# Patient Record
Sex: Female | Born: 1945 | Race: White | Hispanic: No | Marital: Married | Smoking: Never smoker
Health system: Southern US, Community
[De-identification: ages and names within clinical notes are randomized; demographics above are authoritative.]

## PROBLEM LIST (undated history)

## (undated) DIAGNOSIS — Z9889 Other specified postprocedural states: Secondary | ICD-10-CM

## (undated) DIAGNOSIS — M5030 Other cervical disc degeneration, unspecified cervical region: Secondary | ICD-10-CM

## (undated) DIAGNOSIS — I319 Disease of pericardium, unspecified: Secondary | ICD-10-CM

## (undated) DIAGNOSIS — G629 Polyneuropathy, unspecified: Secondary | ICD-10-CM

## (undated) DIAGNOSIS — C539 Malignant neoplasm of cervix uteri, unspecified: Secondary | ICD-10-CM

## (undated) DIAGNOSIS — C801 Malignant (primary) neoplasm, unspecified: Secondary | ICD-10-CM

## (undated) DIAGNOSIS — K222 Esophageal obstruction: Secondary | ICD-10-CM

## (undated) DIAGNOSIS — I10 Essential (primary) hypertension: Secondary | ICD-10-CM

## (undated) DIAGNOSIS — I639 Cerebral infarction, unspecified: Secondary | ICD-10-CM

## (undated) DIAGNOSIS — I340 Nonrheumatic mitral (valve) insufficiency: Secondary | ICD-10-CM

## (undated) DIAGNOSIS — I509 Heart failure, unspecified: Secondary | ICD-10-CM

## (undated) DIAGNOSIS — R112 Nausea with vomiting, unspecified: Secondary | ICD-10-CM

## (undated) DIAGNOSIS — I2510 Atherosclerotic heart disease of native coronary artery without angina pectoris: Secondary | ICD-10-CM

## (undated) DIAGNOSIS — E785 Hyperlipidemia, unspecified: Secondary | ICD-10-CM

## (undated) DIAGNOSIS — T8859XA Other complications of anesthesia, initial encounter: Secondary | ICD-10-CM

## (undated) DIAGNOSIS — T4145XA Adverse effect of unspecified anesthetic, initial encounter: Secondary | ICD-10-CM

## (undated) HISTORY — DX: Polyneuropathy, unspecified: G62.9

## (undated) HISTORY — DX: Esophageal obstruction: K22.2

## (undated) HISTORY — PX: CHOLECYSTECTOMY: SHX55

## (undated) HISTORY — PX: CORONARY ARTERY BYPASS GRAFT: SHX141

## (undated) HISTORY — DX: Other cervical disc degeneration, unspecified cervical region: M50.30

## (undated) HISTORY — DX: Nonrheumatic mitral (valve) insufficiency: I34.0

## (undated) HISTORY — PX: OTHER SURGICAL HISTORY: SHX169

## (undated) HISTORY — DX: Disease of pericardium, unspecified: I31.9

## (undated) HISTORY — DX: Hyperlipidemia, unspecified: E78.5

## (undated) HISTORY — DX: Malignant (primary) neoplasm, unspecified: C80.1

## (undated) HISTORY — DX: Malignant neoplasm of cervix uteri, unspecified: C53.9

## (undated) HISTORY — PX: ABDOMINAL HYSTERECTOMY: SHX81

## (undated) HISTORY — PX: APPENDECTOMY: SHX54

## (undated) HISTORY — DX: Atherosclerotic heart disease of native coronary artery without angina pectoris: I25.10

---

## 1978-10-05 HISTORY — PX: BREAST SURGERY: SHX581

## 1980-10-05 DIAGNOSIS — C801 Malignant (primary) neoplasm, unspecified: Secondary | ICD-10-CM

## 1980-10-05 HISTORY — DX: Malignant (primary) neoplasm, unspecified: C80.1

## 2000-09-22 ENCOUNTER — Encounter: Payer: Self-pay | Admitting: Internal Medicine

## 2000-09-22 ENCOUNTER — Ambulatory Visit (HOSPITAL_COMMUNITY): Admission: RE | Admit: 2000-09-22 | Discharge: 2000-09-22 | Payer: Self-pay | Admitting: Internal Medicine

## 2000-09-22 HISTORY — PX: ESOPHAGOGASTRODUODENOSCOPY: SHX1529

## 2001-10-05 DIAGNOSIS — I2510 Atherosclerotic heart disease of native coronary artery without angina pectoris: Secondary | ICD-10-CM

## 2001-10-05 DIAGNOSIS — I340 Nonrheumatic mitral (valve) insufficiency: Secondary | ICD-10-CM

## 2001-10-05 HISTORY — DX: Nonrheumatic mitral (valve) insufficiency: I34.0

## 2001-10-05 HISTORY — DX: Atherosclerotic heart disease of native coronary artery without angina pectoris: I25.10

## 2002-03-27 ENCOUNTER — Inpatient Hospital Stay (HOSPITAL_COMMUNITY): Admission: EM | Admit: 2002-03-27 | Discharge: 2002-04-14 | Payer: Self-pay | Admitting: *Deleted

## 2002-03-27 ENCOUNTER — Encounter: Payer: Self-pay | Admitting: Internal Medicine

## 2002-03-30 ENCOUNTER — Encounter (HOSPITAL_COMMUNITY): Payer: Self-pay | Admitting: Dentistry

## 2002-03-30 ENCOUNTER — Encounter: Payer: Self-pay | Admitting: Internal Medicine

## 2002-04-01 ENCOUNTER — Encounter: Payer: Self-pay | Admitting: Thoracic Surgery (Cardiothoracic Vascular Surgery)

## 2002-04-03 ENCOUNTER — Encounter: Payer: Self-pay | Admitting: Thoracic Surgery (Cardiothoracic Vascular Surgery)

## 2002-04-04 ENCOUNTER — Encounter: Payer: Self-pay | Admitting: Thoracic Surgery (Cardiothoracic Vascular Surgery)

## 2002-04-05 ENCOUNTER — Encounter: Payer: Self-pay | Admitting: Thoracic Surgery (Cardiothoracic Vascular Surgery)

## 2002-04-07 ENCOUNTER — Encounter: Payer: Self-pay | Admitting: Thoracic Surgery (Cardiothoracic Vascular Surgery)

## 2002-04-08 ENCOUNTER — Encounter: Payer: Self-pay | Admitting: Surgery

## 2002-04-09 ENCOUNTER — Encounter: Payer: Self-pay | Admitting: Gastroenterology

## 2002-05-17 ENCOUNTER — Encounter
Admission: RE | Admit: 2002-05-17 | Discharge: 2002-05-17 | Payer: Self-pay | Admitting: Thoracic Surgery (Cardiothoracic Vascular Surgery)

## 2002-05-17 ENCOUNTER — Encounter: Payer: Self-pay | Admitting: Thoracic Surgery (Cardiothoracic Vascular Surgery)

## 2004-08-14 ENCOUNTER — Ambulatory Visit: Payer: Self-pay | Admitting: Internal Medicine

## 2004-08-14 ENCOUNTER — Ambulatory Visit (HOSPITAL_COMMUNITY): Admission: RE | Admit: 2004-08-14 | Discharge: 2004-08-14 | Payer: Self-pay | Admitting: Internal Medicine

## 2004-09-04 ENCOUNTER — Ambulatory Visit (HOSPITAL_COMMUNITY): Admission: RE | Admit: 2004-09-04 | Discharge: 2004-09-04 | Payer: Self-pay | Admitting: Internal Medicine

## 2005-02-25 ENCOUNTER — Ambulatory Visit: Payer: Self-pay | Admitting: Internal Medicine

## 2005-03-04 ENCOUNTER — Ambulatory Visit: Payer: Self-pay | Admitting: Internal Medicine

## 2005-03-11 ENCOUNTER — Ambulatory Visit: Payer: Self-pay | Admitting: Internal Medicine

## 2005-04-29 ENCOUNTER — Ambulatory Visit: Payer: Self-pay | Admitting: Internal Medicine

## 2005-05-21 ENCOUNTER — Ambulatory Visit: Payer: Self-pay | Admitting: Internal Medicine

## 2006-03-02 ENCOUNTER — Ambulatory Visit: Payer: Self-pay | Admitting: Internal Medicine

## 2006-03-09 ENCOUNTER — Ambulatory Visit: Payer: Self-pay | Admitting: Internal Medicine

## 2006-07-15 ENCOUNTER — Ambulatory Visit: Payer: Self-pay | Admitting: Internal Medicine

## 2006-07-15 LAB — CONVERTED CEMR LAB
Crystals: NEGATIVE
Mucus, UA: NEGATIVE

## 2006-08-10 ENCOUNTER — Ambulatory Visit: Payer: Self-pay | Admitting: Internal Medicine

## 2006-09-06 ENCOUNTER — Ambulatory Visit: Payer: Self-pay | Admitting: Internal Medicine

## 2006-10-05 HISTORY — PX: SPINE SURGERY: SHX786

## 2007-01-04 ENCOUNTER — Ambulatory Visit: Payer: Self-pay | Admitting: Internal Medicine

## 2007-03-16 ENCOUNTER — Ambulatory Visit: Payer: Self-pay | Admitting: Internal Medicine

## 2007-03-16 LAB — CONVERTED CEMR LAB
AST: 19 units/L (ref 0–37)
Albumin: 3.8 g/dL (ref 3.5–5.2)
Basophils Absolute: 0.1 10*3/uL (ref 0.0–0.1)
Bilirubin, Direct: 0.1 mg/dL (ref 0.0–0.3)
Chloride: 102 meq/L (ref 96–112)
Cholesterol: 135 mg/dL (ref 0–200)
Eosinophils Absolute: 0.2 10*3/uL (ref 0.0–0.6)
GFR calc Af Amer: 110 mL/min
GFR calc non Af Amer: 91 mL/min
Glucose, Bld: 151 mg/dL — ABNORMAL HIGH (ref 70–99)
HCT: 32.7 % — ABNORMAL LOW (ref 36.0–46.0)
HDL: 41.4 mg/dL (ref >39.0)
Hemoglobin, Urine: NEGATIVE
Hgb A1c MFr Bld: 7.6 % — ABNORMAL HIGH (ref 4.6–6.0)
Ketones, ur: NEGATIVE mg/dL
LDL Cholesterol: 78 mg/dL (ref 0–99)
Lymphocytes Relative: 26.9 % (ref 12.0–46.0)
MCHC: 35.1 g/dL (ref 30.0–36.0)
MCV: 92.8 fL (ref 78.0–100.0)
Neutro Abs: 4.9 10*3/uL (ref 1.4–7.7)
Neutrophils Relative %: 62 % (ref 43.0–77.0)
Potassium: 4.6 meq/L (ref 3.5–5.1)
RBC: 3.52 M/uL — ABNORMAL LOW (ref 3.87–5.11)
Sodium: 138 meq/L (ref 135–145)
TSH: 0.82 microintl units/mL (ref 0.35–5.50)
Total CHOL/HDL Ratio: 3.3
Urine Glucose: NEGATIVE mg/dL

## 2007-03-22 ENCOUNTER — Ambulatory Visit: Payer: Self-pay | Admitting: Internal Medicine

## 2007-03-28 ENCOUNTER — Ambulatory Visit: Payer: Self-pay | Admitting: Internal Medicine

## 2007-05-25 ENCOUNTER — Ambulatory Visit: Payer: Self-pay | Admitting: Endocrinology

## 2007-05-25 LAB — CONVERTED CEMR LAB
BUN: 19 mg/dL (ref 6–23)
Creatinine, Ser: 0.9 mg/dL (ref 0.4–1.2)

## 2007-05-26 ENCOUNTER — Encounter: Admission: RE | Admit: 2007-05-26 | Discharge: 2007-05-26 | Payer: Self-pay | Admitting: Endocrinology

## 2007-06-07 ENCOUNTER — Observation Stay (HOSPITAL_COMMUNITY): Admission: RE | Admit: 2007-06-07 | Discharge: 2007-06-08 | Payer: Self-pay | Admitting: Neurological Surgery

## 2007-07-05 ENCOUNTER — Ambulatory Visit: Payer: Self-pay | Admitting: Internal Medicine

## 2007-07-05 LAB — CONVERTED CEMR LAB: Hgb A1c MFr Bld: 7.1 % — ABNORMAL HIGH (ref 4.6–6.0)

## 2007-08-19 ENCOUNTER — Encounter: Payer: Self-pay | Admitting: Internal Medicine

## 2007-09-12 ENCOUNTER — Ambulatory Visit: Payer: Self-pay

## 2007-10-04 ENCOUNTER — Ambulatory Visit: Payer: Self-pay | Admitting: Internal Medicine

## 2007-10-04 ENCOUNTER — Ambulatory Visit: Payer: Self-pay

## 2007-10-04 ENCOUNTER — Ambulatory Visit (HOSPITAL_COMMUNITY): Admission: RE | Admit: 2007-10-04 | Discharge: 2007-10-04 | Payer: Self-pay | Admitting: Internal Medicine

## 2007-10-14 ENCOUNTER — Encounter: Payer: Self-pay | Admitting: Internal Medicine

## 2007-10-14 DIAGNOSIS — I2510 Atherosclerotic heart disease of native coronary artery without angina pectoris: Secondary | ICD-10-CM | POA: Insufficient documentation

## 2007-10-14 DIAGNOSIS — Z853 Personal history of malignant neoplasm of breast: Secondary | ICD-10-CM

## 2007-10-14 DIAGNOSIS — E119 Type 2 diabetes mellitus without complications: Secondary | ICD-10-CM

## 2007-10-14 DIAGNOSIS — M5030 Other cervical disc degeneration, unspecified cervical region: Secondary | ICD-10-CM

## 2007-10-14 DIAGNOSIS — E785 Hyperlipidemia, unspecified: Secondary | ICD-10-CM | POA: Insufficient documentation

## 2007-10-14 DIAGNOSIS — I319 Disease of pericardium, unspecified: Secondary | ICD-10-CM | POA: Insufficient documentation

## 2007-10-14 DIAGNOSIS — G5790 Unspecified mononeuropathy of unspecified lower limb: Secondary | ICD-10-CM | POA: Insufficient documentation

## 2007-11-09 ENCOUNTER — Ambulatory Visit: Payer: Self-pay | Admitting: Internal Medicine

## 2007-11-09 LAB — CONVERTED CEMR LAB
Basophils Absolute: 0 10*3/uL (ref 0.0–0.1)
Calcium: 9.2 mg/dL (ref 8.4–10.5)
Chloride: 100 meq/L (ref 96–112)
Eosinophils Absolute: 0.2 10*3/uL (ref 0.0–0.6)
Eosinophils Relative: 2.2 % (ref 0.0–5.0)
GFR calc Af Amer: 82 mL/min
GFR calc non Af Amer: 68 mL/min
Glucose, Bld: 266 mg/dL — ABNORMAL HIGH (ref 70–99)
Lymphocytes Relative: 18 % (ref 12.0–46.0)
MCHC: 33.6 g/dL (ref 30.0–36.0)
MCV: 90.5 fL (ref 78.0–100.0)
Monocytes Relative: 6.3 % (ref 3.0–11.0)
Neutro Abs: 7.3 10*3/uL (ref 1.4–7.7)
Platelets: 287 10*3/uL (ref 150–400)
RBC: 4.03 M/uL (ref 3.87–5.11)
aPTT: 26.8 s (ref 21.7–29.8)

## 2007-11-15 ENCOUNTER — Inpatient Hospital Stay (HOSPITAL_BASED_OUTPATIENT_CLINIC_OR_DEPARTMENT_OTHER): Admission: RE | Admit: 2007-11-15 | Discharge: 2007-11-15 | Payer: Self-pay | Admitting: Internal Medicine

## 2007-11-15 ENCOUNTER — Ambulatory Visit: Payer: Self-pay | Admitting: Cardiology

## 2007-11-16 ENCOUNTER — Inpatient Hospital Stay (HOSPITAL_COMMUNITY): Admission: AD | Admit: 2007-11-16 | Discharge: 2007-11-17 | Payer: Self-pay | Admitting: Cardiology

## 2007-11-16 ENCOUNTER — Ambulatory Visit: Payer: Self-pay | Admitting: Cardiovascular Disease

## 2007-11-17 ENCOUNTER — Encounter: Payer: Self-pay | Admitting: Internal Medicine

## 2007-12-02 ENCOUNTER — Ambulatory Visit: Payer: Self-pay | Admitting: Internal Medicine

## 2007-12-06 ENCOUNTER — Ambulatory Visit: Payer: Self-pay | Admitting: Internal Medicine

## 2007-12-06 LAB — CONVERTED CEMR LAB
ALT: 13 units/L (ref 0–35)
HDL: 43.2 mg/dL (ref >39.0)
Triglycerides: 116 mg/dL (ref 0–149)
VLDL: 23 mg/dL (ref 0–40)

## 2007-12-28 ENCOUNTER — Encounter: Payer: Self-pay | Admitting: Internal Medicine

## 2008-01-02 ENCOUNTER — Ambulatory Visit: Payer: Self-pay | Admitting: Internal Medicine

## 2008-01-04 ENCOUNTER — Encounter: Payer: Self-pay | Admitting: Internal Medicine

## 2008-01-04 ENCOUNTER — Ambulatory Visit: Payer: Self-pay | Admitting: Internal Medicine

## 2008-01-26 ENCOUNTER — Encounter: Payer: Self-pay | Admitting: Internal Medicine

## 2008-02-03 ENCOUNTER — Encounter: Payer: Self-pay | Admitting: Internal Medicine

## 2008-02-24 ENCOUNTER — Ambulatory Visit: Payer: Self-pay | Admitting: Internal Medicine

## 2008-02-24 ENCOUNTER — Encounter (HOSPITAL_COMMUNITY): Admission: RE | Admit: 2008-02-24 | Discharge: 2008-04-04 | Payer: Self-pay | Admitting: Internal Medicine

## 2008-02-29 ENCOUNTER — Ambulatory Visit: Payer: Self-pay | Admitting: Internal Medicine

## 2008-02-29 LAB — CONVERTED CEMR LAB
BUN: 22 mg/dL (ref 6–23)
Chloride: 103 meq/L (ref 96–112)
GFR calc Af Amer: 73 mL/min
GFR calc non Af Amer: 60 mL/min
Glucose, Bld: 225 mg/dL — ABNORMAL HIGH (ref 70–99)
Potassium: 4.7 meq/L (ref 3.5–5.1)
Sodium: 136 meq/L (ref 135–145)

## 2008-03-09 ENCOUNTER — Telehealth: Payer: Self-pay | Admitting: Internal Medicine

## 2008-03-22 ENCOUNTER — Telehealth: Payer: Self-pay | Admitting: Internal Medicine

## 2008-04-11 ENCOUNTER — Ambulatory Visit: Payer: Self-pay | Admitting: Internal Medicine

## 2008-04-11 LAB — CONVERTED CEMR LAB
BUN: 26 mg/dL — ABNORMAL HIGH (ref 6–23)
CO2: 28 meq/L (ref 19–32)
Calcium: 9.2 mg/dL (ref 8.4–10.5)
Chloride: 99 meq/L (ref 96–112)
Creatinine, Ser: 1.1 mg/dL (ref 0.4–1.2)
GFR calc Af Amer: 65 mL/min
GFR calc non Af Amer: 54 mL/min
Glucose, Bld: 243 mg/dL — ABNORMAL HIGH (ref 70–99)
Potassium: 4.4 meq/L (ref 3.5–5.1)
Sodium: 136 meq/L (ref 135–145)

## 2008-04-12 ENCOUNTER — Telehealth: Payer: Self-pay | Admitting: Internal Medicine

## 2008-04-18 ENCOUNTER — Encounter: Payer: Self-pay | Admitting: Internal Medicine

## 2008-05-28 ENCOUNTER — Ambulatory Visit: Payer: Self-pay | Admitting: Internal Medicine

## 2008-06-19 ENCOUNTER — Telehealth: Payer: Self-pay | Admitting: Internal Medicine

## 2008-06-20 ENCOUNTER — Ambulatory Visit: Payer: Self-pay | Admitting: Internal Medicine

## 2008-06-20 ENCOUNTER — Telehealth: Payer: Self-pay | Admitting: Internal Medicine

## 2008-06-20 DIAGNOSIS — K3189 Other diseases of stomach and duodenum: Secondary | ICD-10-CM | POA: Insufficient documentation

## 2008-06-20 DIAGNOSIS — R1013 Epigastric pain: Secondary | ICD-10-CM

## 2008-06-21 ENCOUNTER — Telehealth: Payer: Self-pay | Admitting: Internal Medicine

## 2008-09-13 ENCOUNTER — Telehealth: Payer: Self-pay | Admitting: Internal Medicine

## 2008-10-03 DIAGNOSIS — R0602 Shortness of breath: Secondary | ICD-10-CM

## 2008-12-07 ENCOUNTER — Telehealth: Payer: Self-pay | Admitting: Internal Medicine

## 2009-01-09 ENCOUNTER — Ambulatory Visit: Payer: Self-pay | Admitting: Internal Medicine

## 2009-01-09 LAB — CONVERTED CEMR LAB
BUN: 32 mg/dL — ABNORMAL HIGH (ref 6–23)
Basophils Absolute: 0.1 10*3/uL (ref 0.0–0.1)
Calcium: 10.1 mg/dL (ref 8.4–10.5)
Chloride: 104 meq/L (ref 96–112)
Cholesterol: 128 mg/dL (ref 0–200)
Creatinine, Ser: 1.3 mg/dL — ABNORMAL HIGH (ref 0.4–1.2)
Eosinophils Absolute: 0.2 10*3/uL (ref 0.0–0.7)
HCT: 28.6 % — ABNORMAL LOW (ref 36.0–46.0)
HDL: 38.2 mg/dL — ABNORMAL LOW (ref >39.00)
Hemoglobin: 9.7 g/dL — ABNORMAL LOW (ref 12.0–15.0)
Lymphs Abs: 1.8 10*3/uL (ref 0.7–4.0)
MCHC: 33.9 g/dL (ref 30.0–36.0)
MCV: 88.6 fL (ref 78.0–100.0)
Monocytes Absolute: 0.6 10*3/uL (ref 0.1–1.0)
Monocytes Relative: 9 % (ref 3.0–12.0)
Neutro Abs: 4.5 10*3/uL (ref 1.4–7.7)
Platelets: 266 10*3/uL (ref 150.0–400.0)
RDW: 13.7 % (ref 11.5–14.6)
Triglycerides: 121 mg/dL (ref 0.0–149.0)
VLDL: 24.2 mg/dL (ref 0.0–40.0)
Vitamin B-12: 463 pg/mL (ref 211–911)

## 2009-01-15 ENCOUNTER — Ambulatory Visit: Payer: Self-pay | Admitting: Internal Medicine

## 2009-01-15 DIAGNOSIS — D649 Anemia, unspecified: Secondary | ICD-10-CM | POA: Insufficient documentation

## 2009-01-15 LAB — CONVERTED CEMR LAB: Hemoglobin: 10.1 g/dL — ABNORMAL LOW (ref 12.0–15.0)

## 2009-01-31 ENCOUNTER — Telehealth: Payer: Self-pay | Admitting: Internal Medicine

## 2009-03-28 ENCOUNTER — Telehealth: Payer: Self-pay | Admitting: Internal Medicine

## 2009-05-06 ENCOUNTER — Telehealth: Payer: Self-pay | Admitting: Internal Medicine

## 2009-05-24 ENCOUNTER — Telehealth (INDEPENDENT_AMBULATORY_CARE_PROVIDER_SITE_OTHER): Payer: Self-pay | Admitting: *Deleted

## 2009-08-19 ENCOUNTER — Telehealth: Payer: Self-pay | Admitting: Internal Medicine

## 2009-09-06 ENCOUNTER — Telehealth: Payer: Self-pay | Admitting: Internal Medicine

## 2009-10-08 ENCOUNTER — Telehealth: Payer: Self-pay | Admitting: Internal Medicine

## 2009-11-13 ENCOUNTER — Ambulatory Visit: Payer: Self-pay | Admitting: Internal Medicine

## 2009-11-13 LAB — CONVERTED CEMR LAB
Alkaline Phosphatase: 59 units/L (ref 39–117)
Basophils Absolute: 0.1 10*3/uL (ref 0.0–0.1)
Bilirubin, Direct: 0.1 mg/dL (ref 0.0–0.3)
CO2: 28 meq/L (ref 19–32)
Calcium: 8.9 mg/dL (ref 8.4–10.5)
Creatinine, Ser: 1.3 mg/dL — ABNORMAL HIGH (ref 0.4–1.2)
Eosinophils Absolute: 0.2 10*3/uL (ref 0.0–0.7)
Glucose, Bld: 112 mg/dL — ABNORMAL HIGH (ref 70–99)
HDL: 43.8 mg/dL (ref >39.00)
Hgb A1c MFr Bld: 7.3 % — ABNORMAL HIGH (ref 4.6–6.5)
Lymphocytes Relative: 25.7 % (ref 12.0–46.0)
MCHC: 32.4 g/dL (ref 30.0–36.0)
Neutrophils Relative %: 62.2 % (ref 43.0–77.0)
RBC: 3.44 M/uL — ABNORMAL LOW (ref 3.87–5.11)
RDW: 15.1 % — ABNORMAL HIGH (ref 11.5–14.6)
Specific Gravity, Urine: 1.01 (ref 1.000–1.030)
Total Bilirubin: 0.5 mg/dL (ref 0.3–1.2)
Total CHOL/HDL Ratio: 3
Total Protein, Urine: NEGATIVE mg/dL
Triglycerides: 161 mg/dL — ABNORMAL HIGH (ref 0.0–149.0)
Urine Glucose: NEGATIVE mg/dL
Urobilinogen, UA: 0.2 (ref 0.0–1.0)
VLDL: 32.2 mg/dL (ref 0.0–40.0)

## 2009-11-19 ENCOUNTER — Ambulatory Visit: Payer: Self-pay | Admitting: Internal Medicine

## 2009-12-03 ENCOUNTER — Encounter (INDEPENDENT_AMBULATORY_CARE_PROVIDER_SITE_OTHER): Payer: Self-pay | Admitting: *Deleted

## 2009-12-03 ENCOUNTER — Telehealth: Payer: Self-pay | Admitting: Internal Medicine

## 2010-01-17 ENCOUNTER — Telehealth: Payer: Self-pay | Admitting: Internal Medicine

## 2010-02-03 ENCOUNTER — Encounter: Payer: Self-pay | Admitting: Internal Medicine

## 2010-02-03 LAB — HM MAMMOGRAPHY: HM Mammogram: NORMAL

## 2010-02-18 ENCOUNTER — Ambulatory Visit: Payer: Self-pay | Admitting: Internal Medicine

## 2010-02-18 LAB — CONVERTED CEMR LAB: Hemoglobin: 12 g/dL (ref 12.0–15.0)

## 2010-02-21 ENCOUNTER — Telehealth: Payer: Self-pay | Admitting: Internal Medicine

## 2010-03-12 ENCOUNTER — Telehealth (INDEPENDENT_AMBULATORY_CARE_PROVIDER_SITE_OTHER): Payer: Self-pay | Admitting: *Deleted

## 2010-05-22 ENCOUNTER — Telehealth: Payer: Self-pay | Admitting: Internal Medicine

## 2010-07-17 ENCOUNTER — Ambulatory Visit: Payer: Self-pay | Admitting: Internal Medicine

## 2010-07-17 DIAGNOSIS — M79609 Pain in unspecified limb: Secondary | ICD-10-CM

## 2010-09-04 ENCOUNTER — Telehealth: Payer: Self-pay | Admitting: Internal Medicine

## 2010-11-04 NOTE — Progress Notes (Signed)
  Phone Note Refill Request Message from:  Fax from Pharmacy on October 08, 2009 3:02 PM  Refills Requested: Medication #1:  ZOLPIDEM TARTRATE 10 MG  TABS take 1 by mouth qhs can pt have refill? Please Advise  Initial call taken by: Ami Bullins CMA,  October 08, 2009 3:02 PM  Follow-up for Phone Call        OK for refill as needed  Follow-up by: Jacques Navy MD,  October 08, 2009 6:23 PM    Prescriptions: ZOLPIDEM TARTRATE 10 MG  TABS (ZOLPIDEM TARTRATE) take 1 by mouth qhs  #30 x 1   Entered by:   Ami Bullins CMA   Authorized by:   Jacques Navy MD   Signed by:   Bill Salinas CMA on 10/09/2009   Method used:   Telephoned to ...       [facility]       [address]       [place], [place]  [address]       Ph: [phone]       Fax: [phone]   RxID:   [id number]

## 2010-11-04 NOTE — Assessment & Plan Note (Signed)
Summary: STOMACH PROBLEMS/NWS   Vital Signs:  Patient profile:   65 year old female Height:      65 inches Weight:      190 pounds BMI:     31.73 O2 Sat:      98 % on Room air Temp:     97.9 degrees F oral Pulse rate:   82 / minute BP sitting:   114 / 60  (right arm) Cuff size:   large  Vitals Entered By: Bill Salinas CMA (Feb 18, 2010 1:53 PM)  O2 Flow:  Room air CC: pt here with c/o decreased appetite with decreased energy/ ab   Primary Care Provider:  Jacques Navy MD  CC:  pt here with c/o decreased appetite with decreased energy/ ab.  History of Present Illness: Patient reports a five month history of decreased appetite and stomach pain.  In February she was seen in the office and started on Nexium.  Her symptoms improved dramatically on Nexium, but returned when she switched to Ranitidine.  After a trial of ranitidine with return of symptoms she called the office and was counseled to try Prilosec.  She is currently taking Prilosec 20mg  once daily without complete resolution of symptoms.    She currently describes her discomfort as back pain- a dull ache across the mid thoracic region.  She has decreased appetite and has to force herself to eat food.  She had one episode of nausea and reports no vomiting.  She denies heartburn/reflux symptoms.  She says that the symptoms are worst after eating breakfast and taking her morning meds.  She does try to eat foods that she thinks improves symptoms (dairy, cottage cheese) and avoid foods that seem to worsen symptoms (fatty foods).  She does have intermittent diarrhea which she has associated with fat intake after her gallbladder removal.  She has seen no blood in her stools, although they have turned dark since she began taking ferrous sulfate in February.    She was found to be anemic at her visit in February.  She reports continued lack of energy, which her husband says is significantly worse.    Current Medications  (verified): 1)  Metformin Hcl 1000 Mg  Tabs (Metformin Hcl) .... Take 1 By Mouth Two Times A Day Qd 2)  Glipizide Xl 10 Mg  Tb24 (Glipizide) .... Take 1 By Mouth Qd 3)  Lisinopril 20 Mg  Tabs (Lisinopril) .... Take 1 By Mouth Qd 4)  Simvastatin 40 Mg  Tabs (Simvastatin) .... Take 1 By Mouth Qd 5)  Zolpidem Tartrate 10 Mg  Tabs (Zolpidem Tartrate) .... Take 1 By Mouth Qhs 6)  Lasix 40 Mg Tabs (Furosemide) .... Take 1 Tablet By Mouth Once A Day and  1 1/2 Tab As Needed 7)  Plavix 75 Mg Tabs (Clopidogrel Bisulfate) .Marland Kitchen.. 1 By Mouth Once Daily 8)  Metoprolol Tartrate 25 Mg Tabs (Metoprolol Tartrate) .... 1/2 Tab Two Times A Day 9)  Ranitidine Hcl 150 Mg Caps (Ranitidine Hcl) .Marland Kitchen.. 1 Two Times A Day 10)  Oxybutynin Chloride 5 Mg Tabs (Oxybutynin Chloride) .Marland Kitchen.. 1 By Mouth Qpm 11)  Aspirin 81 Mg  Tabs (Aspirin) .... Take 1 Tablet By Mouth Once A Day 12)  Endocet 5-325 Mg Tabs (Oxycodone-Acetaminophen) .Marland Kitchen.. 1 By Mouth Q6 As Needed Neck Pain 13)  Ferrous Sulfate 325 (65 Fe) Mg Tabs (Ferrous Sulfate) .Marland Kitchen.. 1 By Mouth Two Times A Day 14)  Prilosec Otc 20 Mg Tbec (Omeprazole Magnesium) .Marland Kitchen.. 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  The patient denies fever, weight gain, chest pain, syncope, dyspnea on exertion, prolonged cough, headaches, hemoptysis, hematochezia, and severe indigestion/heartburn.    Physical Exam  General:  Alert well developed woman who appears tired and somewhat pale. Head:  normocephalic and atraumatic.   Lungs:  normal respiratory effort, no accessory muscle use, normal breath sounds, no crackles, and no wheezes.   Heart:  normal rate, regular rhythm, no murmur, no gallop, and no rub.   Abdomen:  soft, non-tender, normal bowel sounds, no distention, no masses, no guarding, no rigidity, no rebound tenderness, no hepatomegaly, and no splenomegaly.   Neurologic:  alert & oriented X3.     Impression & Recommendations:  Problem # 1:  DYSPEPSIA (ICD-536.8) She has continued to have abdominal and stomach issues since her visit in February.  She did have a positive response to PPIs in February.  It is possible that she is not currently on an adequate dose of prilosec to control her symptoms.    Plan- Double dose of Prilosec to 40mg  once daily          If symptoms are not improved with increased dose will have to consider further testing, such as EGD.  Problem # 2:  ANEMIA, NORMOCYTIC (ICD-285.9) Patient reports continued decrease in energy.  She has been taking the ferrous sulfate daily.  It is possible that this anemia is related to a GI bleed given her recurrent stomach problems and dark stools.  An FOBT in February was negative.    Plan- Hemoglobin check today          If worsening anemia will consider upper and lower endoscopy.  Her updated medication list for this problem includes:    Ferrous Sulfate 325 (65 Fe) Mg Tabs (Ferrous sulfate) .Marland Kitchen... 1 by mouth two times a  day  Orders: TLB-Hemoglobin (Hgb) (85018-HGB)  Problem # 3:  CORONARY ARTERY DISEASE (ICD-414.00) Patient has not been taking aspirin recently due to GI symptoms.  She is currently on Plavix secondary to her bare metal stent placed several years ago.    Plan- Continue Plavix until it is clear if patient has a GI bleed or not.  If GI bleed is ruled out will discontinue plavix and restart low dose aspirin.   Her updated medication list for this problem includes:    Lisinopril 20 Mg Tabs (Lisinopril) .Marland Kitchen... Take 1 by mouth qd    Lasix 40 Mg Tabs (Furosemide) .Marland Kitchen... Take 1 tablet by mouth once a day and  1 1/2 tab as needed    Plavix 75 Mg Tabs (Clopidogrel bisulfate) .[facility] 1  by mouth once daily    Metoprolol Tartrate 25 Mg Tabs (Metoprolol tartrate) .Marland Kitchen... 1/2 tab two times a day    Aspirin 81 Mg Tabs (Aspirin) .Marland Kitchen... Take 1 tablet by mouth once a day  Complete Medication List: 1)  Metformin Hcl 1000 Mg Tabs (Metformin hcl) .... Take 1 by mouth two times a day qd 2)  Glipizide Xl 10 Mg Tb24 (Glipizide) .... Take 1 by mouth qd 3)  Lisinopril 20 Mg Tabs (Lisinopril) .... Take 1 by mouth qd 4)  Simvastatin 40 Mg Tabs (Simvastatin) .... Take 1 by mouth qd 5)  Zolpidem Tartrate 10 Mg Tabs (Zolpidem tartrate) .... Take 1 by mouth qhs 6)  Lasix 40 Mg Tabs (Furosemide) .... Take 1 tablet by mouth once a day and  1 1/2 tab as needed 7)  Plavix 75 Mg Tabs (Clopidogrel bisulfate) .Marland Kitchen.. 1 by mouth once daily 8)  Metoprolol Tartrate 25 Mg Tabs (Metoprolol tartrate) .... 1/2 tab two times a day 9)  Ranitidine Hcl 150 Mg Caps (Ranitidine hcl) .Marland Kitchen.. 1 two times a day 10)  Oxybutynin Chloride 5 Mg Tabs (Oxybutynin chloride) .Marland Kitchen.. 1 by mouth qpm 11)  Aspirin 81 Mg Tabs (Aspirin) .... Take 1 tablet by mouth once a day 12)  Endocet 5-325 Mg Tabs (Oxycodone-acetaminophen) .Marland Kitchen.. 1 by mouth q6 as needed neck pain 13)  Ferrous Sulfate 325 (65 Fe) Mg Tabs (Ferrous sulfate) .Marland Kitchen.. 1 by mouth two times a day 14)  Prilosec  Otc 20 Mg Tbec (Omeprazole magnesium) .Marland Kitchen.. 1 once daily

## 2010-11-04 NOTE — Progress Notes (Signed)
  Phone Note Other Incoming   Request: Send information Summary of Call: Request for records received from [facility]. Request forwarded to [employer].     

## 2010-11-04 NOTE — Letter (Signed)
Summary: [facility] Referral (unable to schedule) Notification  [facility]  [address] [place], [place] [address]   Phone: [phone]  Fax: [phone]      December 03, 2009 Jacqueline Lamb 04-Dec-1945 MRN: [id number]   [facility] [address] [place], [place]  [address]   Dear Dr. Debby Bud:   Thank you for your kind referral of the above patient. We have attempted to schedule the recommended Colonoscopy but have been unable to schedule because:  __ The patient was not available by phone and/or has not returned our calls.  _x_ The patient declined to schedule the procedure at this time. Cannot afford procedure at this time.  We appreciate the referral and hope that we will have the opportunity to treat this patient in the future.    Sincerely,   [facility]  Vania Rea. Jarold Motto M.D. Hedwig Morton. Juanda Chance M.D. Venita Lick. Russella Dar M.D. Wilhemina Bonito. Marina Goodell M.D. Barbette Hair. Arlyce Dice M.D. Iva Boop M.D. Cheron Every.D.

## 2010-11-04 NOTE — Progress Notes (Signed)
Summary: PPI  Phone Note Call from Patient Call back at [facility] Phone [phone]   Summary of Call: Pt was on nexium x 1 mth and then resumed raniditine 150mg  1 two times a day. Her symptoms were controlled on nexium, but pt is on plavix. Should she increase ranitidine? or try another PPI? Initial call taken by: Lamar Sprinkles, CMA,  January 17, 2010 8:48 AM  Follow-up for Phone Call        If her symptoms are controlled on  Ranitidine she can continue that. If her symptoms recurred after stopping nexium and starting ranitidine she should use otc Prilosec 20mg  q AM Follow-up by: Jacques Navy MD,  January 17, 2010 8:56 AM  Additional Follow-up for Phone Call Additional follow up Details #1::        Pt informed< EMR UPDATED Additional Follow-up by: Lamar Sprinkles, CMA,  January 17, 2010 9:21 AM    New/Updated Medications: PRILOSEC OTC 20 MG TBEC (OMEPRAZOLE MAGNESIUM) 1 once daily

## 2010-11-04 NOTE — Progress Notes (Signed)
Summary: NEXIUM ?  Phone Note Call from Patient Call back at [facility] Phone [phone] Call back at [facility] VM ON HM #    Summary of Call: Pt took Nexium x 15 days and feels better. She would like to know if she needs to continue or stop and resume ranitidine. Please adivse.  If she is to continue Nexium, pt needs rx & to know if it is ok to take plavix along with it.   Initial call taken by: Lamar Sprinkles, CMA,  December 03, 2009 9:46 AM  Follow-up for Phone Call        She should take for another 15 days - ok for samples. After next 15 days ok to resume ranitidine.  Continue plavix. There is a small risk of plavix failure - but low.  Follow-up by: Jacques Navy MD,  December 03, 2009 11:49 AM  Additional Follow-up for Phone Call Additional follow up Details #1::        Pt informed  Additional Follow-up by: Lamar Sprinkles, CMA,  December 03, 2009 2:25 PM

## 2010-11-04 NOTE — Progress Notes (Signed)
Summary: Oycodone  Phone Note Refill Request Message from:  Patient on September 04, 2010 10:37 AM  Refills Requested: Medication #1:  ENDOCET 5-325 MG TABS 1 by mouth q6 as needed neck pain   Dosage confirmed as above?Dosage Confirmed   Supply Requested: 1 month  Method Requested: Pick up at Office Initial call taken by: Margaret Pyle, CMA,  September 04, 2010 10:37 AM  Follow-up for Phone Call        K. 30 days supply with 3 prescriptions. THANKS Follow-up by: Jacques Navy MD,  September 04, 2010 12:58 PM  Additional Follow-up for Phone Call Additional follow up Details #1::        lm to inform pt that her prescriptions are ready. Prescriptions up front in cabinet Additional Follow-up by: Ami Bullins CMA,  September 05, 2010 9:29 AM    Additional Follow-up for Phone Call Additional follow up Details #2::    Pt informed  Follow-up by: Lamar Sprinkles, CMA,  September 05, 2010 12:13 PM  New/Updated Medications: ENDOCET 5-325 MG TABS (OXYCODONE-ACETAMINOPHEN) 1 by mouth q6 as needed neck pain fill on or after 09/04/2010 ENDOCET 5-325 MG TABS (OXYCODONE-ACETAMINOPHEN) 1 by mouth q6 as needed neck pain fill on or after 10/05/2010 ENDOCET 5-325 MG TABS (OXYCODONE-ACETAMINOPHEN) 1 by mouth q6 as needed neck pain fill on or after 11/05/2010 Prescriptions: ENDOCET 5-325 MG TABS (OXYCODONE-ACETAMINOPHEN) 1 by mouth q6 as needed neck pain fill on or after 11/05/2010  #120 x 0   Entered by:   Ami Bullins CMA   Authorized by:   Jacques Navy MD   Signed by:   Bill Salinas CMA on 09/04/2010   Method used:   Print then Give to Patient   RxID:   [id number] ENDOCET 5-325 MG TABS (OXYCODONE-ACETAMINOPHEN) 1 by mouth q6 as needed neck pain fill on or after 10/05/2010  #120 x 0   Entered by:   Ami Bullins CMA   Authorized by:   Jacques Navy MD   Signed by:   Bill Salinas CMA on 09/04/2010   Method used:   Print then Give to Patient   RxID:   [id number] ENDOCET 5-325  MG TABS (OXYCODONE-ACETAMINOPHEN) 1 by mouth q6 as needed neck pain fill on or after 09/04/2010  #120 x 0   Entered by:   Ami Bullins CMA   Authorized by:   Jacques Navy MD   Signed by:   Bill Salinas CMA on 09/04/2010   Method used:   Print then Give to Patient   RxID:   [id number]

## 2010-11-04 NOTE — Progress Notes (Signed)
Summary: LETTER REQUESTED  Phone Note Call from Patient Call back at Home Phone [phone]   Summary of Call: Pt recieved a letter from the social security administration regarding disability. It is requesting for proof of medical evidence and she is req assistance from Dr Debby Bud. Pt has apt w/SS June 7th.  Initial call taken by: Lamar Sprinkles, CMA,  Feb 21, 2010 3:32 PM  Follow-up for Phone Call        Please advise on this, Pt's apt is next week. She also wants application for handicapp form.  Follow-up by: Lamar Sprinkles, CMA,  March 05, 2010 12:01 PM  Additional Follow-up for Phone Call Additional follow up Details #1::        Letter Dictated by MD, printed and stamped w/signature. Pt informed. Ok for handicap placard?  Additional Follow-up by: Lamar Sprinkles, CMA,  March 10, 2010 1:50 PM      Preventive Care Screening  Mammogram:    Date:  02/03/2010    Results:  normal bilateral

## 2010-11-04 NOTE — Assessment & Plan Note (Signed)
Summary: bilateral foot pain/#/cd   Vital Signs:  Patient profile:   65 year old female Height:      65 inches Weight:      188 pounds BMI:     31.40 O2 Sat:      97 % on Room air Temp:     98.0 degrees F oral Pulse rate:   82 / minute BP sitting:   112 / 70  (left arm) Cuff size:   regular  Vitals Entered By: Bill Salinas CMA (July 17, 2010 11:27 AM)  O2 Flow:  Room air CC: ov for evaluation of bilateral foot pain/ ?gout Comments Pt states she is not taking Zolpidem, plavix, ranitidine, asa or oxybutynin/ ab   Primary Care Provider:  Jacques Navy MD  CC:  ov for evaluation of bilateral foot pain/ ?gout.  History of Present Illness: Patient presents for a 6 week history of foot pain that has been debilitating leaving her bedbound for much of the time. she reports that the pain is both feet and involved the toes and forefoot. She has not had a single fiery red, swollend and exquisitely tender joint. She has self-diagnosed as having gout. Chart reviewed - no h/o gout, no prior lab - uric acid. She has not tried any medicatoin for this pain.   Current Medications (verified): 1)  Metformin Hcl 1000 Mg  Tabs (Metformin Hcl) .... Take 1 By Mouth Two Times A Day Qd 2)  Glipizide Xl 10 Mg  Tb24 (Glipizide) .... Take 1 By Mouth Qd 3)  Lisinopril 20 Mg  Tabs (Lisinopril) .... Take 1 By Mouth Qd 4)  Simvastatin 40 Mg  Tabs (Simvastatin) .... Take 1 By Mouth Qd 5)  Zolpidem Tartrate 10 Mg  Tabs (Zolpidem Tartrate) .... Take 1 By Mouth Qhs 6)  Lasix 40 Mg Tabs (Furosemide) .... Take 1 Tablet By Mouth Once A Day and  1 1/2 Tab As Needed 7)  Plavix 75 Mg Tabs (Clopidogrel Bisulfate) .Marland Kitchen.. 1 By Mouth Once Daily 8)  Metoprolol Tartrate 25 Mg Tabs (Metoprolol Tartrate) .... 1/2 Tab Two Times A Day 9)  Ranitidine Hcl 150 Mg Caps (Ranitidine Hcl) .Marland Kitchen.. 1 Two Times A Day 10)  Oxybutynin Chloride 5 Mg Tabs (Oxybutynin Chloride) .Marland Kitchen.. 1 By Mouth Qpm 11)  Aspirin 81 Mg  Tabs (Aspirin) .... Take 1  Tablet By Mouth Once A Day 12)  Endocet 5-325 Mg Tabs (Oxycodone-Acetaminophen) .Marland Kitchen.. 1 By Mouth Q6 As Needed Neck Pain 13)  Ferrous Sulfate 325 (65 Fe) Mg Tabs (Ferrous Sulfate) .Marland Kitchen.. 1 By Mouth Two Times A Day 14)  Prilosec Otc 20 Mg Tbec (Omeprazole Magnesium) .Marland Kitchen.. 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    Physical Exam  General:  Well-developed,well-nourished,in no acute distress; alert,appropriate and cooperative throughout examination Lungs:  normal respiratory effort and normal breath sounds.   Heart:  normal rate and regular rhythm.   Msk:  right foot with a mild valgus deformity 1st MTP joint. There is no acute tenderness to MTP joints, no fiery red  skin changes, no acute tenderness over the small joints of the foot. Mild tenderness to hte arch and dorsum of the foot.  No deformity of the other toes.  Pulses:  2+ radial  Neurologic:  alert & oriented X3 and cranial nerves II-XII intact.     Impression & Recommendations:  Problem # 1:  FOOT PAIN, BILATERAL (ICD-729.5) Patinet with severe foot pain. No evidence by history, previous lab or exm of gout. 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  Plan - trial of aleve 1 or 2 tabs two times a day.           for unrelieved pain will need xrays and lab to include uric acid.   Complete Medication List: 1)  Metformin Hcl 1000 Mg Tabs (Metformin hcl) .... Take 1 by mouth two times a day qd 2)  Glipizide Xl 10 Mg Tb24 (Glipizide) .... Take 1 by mouth qd 3)  Lisinopril 20 Mg Tabs (Lisinopril) .... Take 1 by mouth qd 4)  Simvastatin 40 Mg Tabs (Simvastatin) .... Take 1 by mouth qd 5)  Zolpidem Tartrate 10 Mg Tabs (Zolpidem tartrate) .... Take 1 by mouth qhs 6)  Lasix 40 Mg Tabs (Furosemide) .... Take 1 tablet by mouth once a day and  1 1/2 tab as needed 7)  Plavix 75 Mg Tabs (Clopidogrel bisulfate) .Marland Kitchen.. 1 by mouth once daily 8)  Metoprolol Tartrate 25 Mg Tabs (Metoprolol tartrate) .... 1/2 tab two times a day 9)  Ranitidine Hcl 150 Mg Caps (Ranitidine hcl) .Marland Kitchen.. 1 two times a day 10)  Oxybutynin Chloride 5 Mg Tabs (Oxybutynin chloride) .Marland Kitchen.. 1 by mouth qpm 11)  Aspirin 81 Mg Tabs (Aspirin) .... Take 1 tablet by mouth once a day 12)  Endocet 5-325 Mg Tabs (Oxycodone-acetaminophen) .Marland Kitchen.. 1 by mouth q6 as needed neck pain 13)  Ferrous Sulfate 325 (65 Fe) Mg Tabs (Ferrous sulfate) .Marland Kitchen.. 1 by mouth two times a day 14)  Prilosec Otc 20 Mg Tbec (Omeprazole magnesium) .Marland Kitchen.. 1 once daily  Other Orders: Admin 1st Vaccine ([address]) Flu Vaccine 72yrs + ([address])    Flu Vaccine Consent Questions     Do you have a history of severe allergic reactions to this vaccine? no    Any prior history of allergic reactions to egg and/or  gelatin? no    Do you have a sensitivity to the preservative Thimersol? no    Do you have a past history of Guillan-Barre Syndrome? no    Do you currently have an acute febrile illness? no    Have you ever had a severe reaction to latex? no    Vaccine information given and explained to patient? yes    Are you currently pregnant? no    Lot Number:[id number]   Exp Date:04/04/2011   Site Given  Right Deltoid IMflu ...... Lamar Sprinkles, CMA  July 17, 2010 12:34 PM

## 2010-11-04 NOTE — Progress Notes (Signed)
Summary: REFILL  Phone Note Refill Request   Refills Requested: Medication #1:  ENDOCET 5-325 MG TABS 1 by mouth q6 as needed neck pain Initial call taken by: Lamar Sprinkles, CMA,  May 22, 2010 3:07 PM  Follow-up for Phone Call        ok for refill Follow-up by: Jacques Navy MD,  May 22, 2010 6:26 PM    Prescriptions: ENDOCET 5-325 MG TABS (OXYCODONE-ACETAMINOPHEN) 1 by mouth q6 as needed neck pain  #120 x 0   Entered by:   Bill Salinas CMA   Authorized by:   Jacques Navy MD   Signed by:   Bill Salinas CMA on 05/23/2010   Method used:   Telephoned to ...       [facility]       [address]       [place], [place]  [address]       Ph: [phone]       Fax: [phone]   RxID:   [id number]

## 2010-11-04 NOTE — Assessment & Plan Note (Signed)
Summary: physical--stc   Vital Signs:  Patient profile:   65 year old female Height:      65 inches Weight:      193.75 pounds BMI:     32.36 O2 Sat:      98 % on Room air Temp:     98.6 degrees F oral Pulse rate:   88 / minute BP sitting:   114 / 62  (right arm) Cuff size:   large  Vitals Entered By: Brenton Grills (November 19, 2009 1:31 PM)  O2 Flow:  Room air CC: cpx/decreased appetite/discuss med refills/aj   Primary Care Provider:  Jacques Navy MD  CC:  cpx/decreased appetite/discuss med refills/aj.  History of Present Illness: Patient presents for routine medical follow-up. she has lost 12 lbs and reports having a poor appetite for several weeks starting with a sort illness with N/V. She reports that food doesn't taste good and her stomach bothers her: pain at times relieved with eating; early satiety, no increased eructation or flatus, no change in bowel habit or change in the stool. She is favoring bland foods. She does continue to take ranitidine. She does have more abdominal pain and discomfort.   She reports that she doesn't feel very good! She has not had fluid overload for a while. She does have neck and shoulder pain - for which she takes oxycodone/APAP, usually at night every couple of days.  No association with oxycodone.   She does have occasional left arm pain - relatively minor and not new.  No crushing chest pain. She is 25+ years out from mastectomy with no recent follow-up: CXR, bone scan. She reports that she did have a mammogram '09.   Current Medications (verified): 1)  Metformin Hcl 1000 Mg  Tabs (Metformin Hcl) .... Take 1 By Mouth Two Times A Day Qd 2)  Glipizide Xl 10 Mg  Tb24 (Glipizide) .... Take 1 By Mouth Qd 3)  Lisinopril 20 Mg  Tabs (Lisinopril) .... Take 1 By Mouth Qd 4)  Simvastatin 40 Mg  Tabs (Simvastatin) .... Take 1 By Mouth Qd 5)  Zolpidem Tartrate 10 Mg  Tabs (Zolpidem Tartrate) .... Take 1 By Mouth Qhs 6)  Lasix 40 Mg Tabs  (Furosemide) .... Take 1 Tablet By Mouth Once A Day and  1 1/2 Tab As Needed 7)  Plavix 75 Mg Tabs (Clopidogrel Bisulfate) .Marland Kitchen.. 1 By Mouth Once Daily 8)  Metoprolol Tartrate 25 Mg Tabs (Metoprolol Tartrate) .... 1/2 Tab Two Times A Day 9)  Ranitidine Hcl 150 Mg Caps (Ranitidine Hcl) .Marland Kitchen.. 1 Two Times A Day 10)  Oxybutynin Chloride 5 Mg Tabs (Oxybutynin Chloride) .Marland Kitchen.. 1 By Mouth Qpm 11)  Aspirin 81 Mg  Tabs (Aspirin) .... Take 1 Tablet By Mouth Once A Day 12)  Endocet 5-325 Mg Tabs (Oxycodone-Acetaminophen) .Marland Kitchen.. 1 By Mouth Q6 As Needed Neck Pain 13)  Ferrous Sulfate 325 (65 Fe) Mg Tabs (Ferrous Sulfate) .Marland Kitchen.. 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 Appendectomy Coronary artery bypass graft MV annuloplasty Cholecystectomy TAH/BSO Appendectomy Mastectomy, L  breast 1980s EGD (09/22/2000) C-spine diskectomy and fusion C6-7 Fall '08 ([facility])  Family History: Reviewed history from 10/03/2008 and no changes required. Mother:  Question CHF, dementia Father died at age 72 following neurosurgery.  Social History: Reviewed history from 10/03/2008 and no changes required. 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 No tobacco No EtOH  Review of Systems       The patient complains of anorexia and weight loss.  The patient denies fever, weight gain, vision loss, decreased hearing, chest pain, syncope, peripheral edema, prolonged cough, headaches, hemoptysis, abdominal pain, severe indigestion/heartburn,  incontinence, suspicious skin lesions, transient blindness, difficulty walking, unusual weight change, enlarged lymph nodes, angioedema, and breast masses.    Physical Exam  General:  WNWD pale woman in no acute distress Head:  Normocephalic and atraumatic without obvious abnormalities. No apparent alopecia or balding. Eyes:  vision grossly intact, pupils equal, pupils round, corneas and lenses clear, no injection, no optic disk abnormalities, and no retinal abnormalitiies.   Ears:  R ear normal and L ear normal.   Nose:  External nasal examination shows no deformity or inflammation. Nasal mucosa are pink and moist without lesions or exudates. Mouth:  Oral mucosa and oropharynx without lesions or exudates.  Teeth in good repair. Neck:  supple, full ROM, no thyromegaly, and no carotid bruits.   Chest Wall:  sternotomy scar,  Breasts:  absent left breast - chest wall seems normal. Right breast without masses or lesions  Lungs:  Normal respiratory effort, chest expands symmetrically. Lungs are clear to auscultation, no crackles or wheezes. Heart:  Normal rate and regular rhythm. S1 and S2 normal without gallop, murmur, click, rub or other extra sounds. Abdomen:  soft, non-tender, normal bowel sounds, no distention, no masses, no guarding, and no hepatomegaly.   Rectal:  external hemorrhoids without inflammation, NST, no masses in the rectal vault. Stool heme negative Msk:  normal ROM, no joint tenderness, no joint swelling, no joint warmth, and no joint deformities.   Pulses:  2+ radial, trace to absent DP pulses Extremities:  No clubbing, cyanosis, edema, or deformity noted with normal full range of motion of all joints.   Neurologic:  alert & oriented X3, cranial nerves II-XII intact, strength normal in all extremities, sensation intact to light touch, sensation intact to pinprick, gait normal, and DTRs symmetrical and normal.   Skin:  turgor normal, color normal, no ecchymoses, no petechiae, and  no ulcerations.  Nl blanching and capillary refill nailbeds Cervical Nodes:  no anterior cervical adenopathy and no posterior cervical adenopathy.   Axillary Nodes:  no R axillary adenopathy and no L axillary adenopathy.   Psych:  Oriented X3, memory intact for recent and remote, normally interactive, and good eye contact.     Impression & Recommendations:  Problem # 1:  ANEMIA, NORMOCYTIC (ICD-285.9) Reveiwed previous exam last year including labs which clearly demonstratred iron deficiency anemia with a normal reticulocyte count indicating normal bonemaroow function. SAhe does have symptoms suggestive of gastritis or other UGI  problems. She does take low dose aspirin as well as plavix. Cannot rule out lower GI origin despite heme negative stool. She has not had colonoscopy.  Plan - PPI (nexium) daily - low risk with a bare metal stent on Plavix) 15 days of samples. If symptoms do not abate will need GI consult           Continue iron supplementation.           Stop aspirin  Will investigate her insurance coverage for colonoscopy - recommended if affordable.   Her updated medication list for this problem includes:    Ferrous Sulfate 325 (65 Fe) Mg Tabs (Ferrous sulfate) .Marland Kitchen... 1 by mouth two times a day  Orders: Gastroenterology Referral (GI)  Problem # 2:  CORONARY ARTERY DISEASE (ICD-414.00) Stable with no chest pain. Last visit to Dr. Tenny Craw in '09. She did come off of plavix but due to recurrent chest pain about 1 year ago and concern for in-stent stenosis she was restarted on plavix and metoprolol was added to her regimen. She has done well with no complaint of chest pain. She has not had any cardiac risk stratification or testing since last cath Feb '09.  Plan - will inquire of Dr. Tenny Craw if she is safe to come off plavix 2 years our from a bare metal stent.            She will continue all other meds.           She does need to see Dr. Tenny Craw during the next several  months  Her updated medication list for this problem includes:    Lisinopril 20 Mg Tabs (Lisinopril) .Marland Kitchen... Take 1 by mouth qd    Lasix 40 Mg Tabs (Furosemide) .Marland Kitchen... Take 1 tablet by mouth once a day and  1 1/2 tab as needed    Plavix 75 Mg Tabs (Clopidogrel bisulfate) .Marland Kitchen... 1 by mouth once daily    Metoprolol Tartrate 25 Mg Tabs (Metoprolol tartrate) .Marland Kitchen... 1/2 tab two times a day    Aspirin 81 Mg Tabs (Aspirin) .Marland Kitchen... Take 1 tablet by mouth once a day  Problem # 3:  HYPERLIPIDEMIA (ICD-272.4)  Her updated medication list for this problem includes:    Simvastatin 40 Mg Tabs (Simvastatin) .Marland Kitchen... Take 1 by mouth qd  Labs Reviewed: SGOT: 17 (11/13/2009)   SGPT: 13 (11/13/2009)   HDL:43.80 (11/13/2009), 38.20 (01/09/2009)  LDL:66 (11/13/2009), 66 (01/09/2009)  Chol:142 (11/13/2009), 128 (01/09/2009)  Trig:161.0 (11/13/2009), 121.0 (01/09/2009)  Excellent control of lipids  Plan - continue present medications.   Problem # 4:  DIABETES MELLITUS, TYPE II (ICD-250.00)  Her updated medication list for this problem includes:    Metformin Hcl 1000 Mg Tabs (Metformin hcl) .Marland Kitchen... Take 1 by mouth two times a day qd    Glipizide Xl 10 Mg Tb24 (Glipizide) .Marland Kitchen... Take 1 by mouth qd    Lisinopril 20 Mg Tabs (Lisinopril) .Marland Kitchen... Take 1 by mouth qd    Aspirin 81 Mg Tabs (Aspirin) .Marland Kitchen... Take 1 tablet by mouth once a day  Labs Reviewed: Creat: 1.3 (11/13/2009)    Reviewed HgBA1c results: 6.9 (01/09/2009)  7.1 (07/05/2007) A1C Feb '11 pending  Plan - continue present medications  Problem # 5:  BREAST CANCER, HX OF (ICD-V10.3) S/P radical left mastectomy. She has not had recent follow-up. No bone scan in available in EMR, echart or [facility]. She has no bone pain. She has had recent mammogram right breast. Exam today is normal.  Problem # 6:  DEGENERATIVE DISC DISEASE, CERVICAL SPINE (ICD-722.4) Patient is doing relatively well. She continues to have some neck pain and discomfort but it is manageable.    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 No muscle wasting or weakness.  Problem # 8:  Preventive Health Care (ICD-V70.0) Relatively normal exam. Labs look fine except for anemia with Hgb 9.7. She is current  with mammography. Colonsocopy is recommended but cost considerations are important.  In summary - a very nice woman with fatigue and malaise that may be related to anemia. She has no active cardiac complaints but should consider follow-up with cardiology. she will try nexium for relief of abdominalk pain, eructation and early satiety. She will get back to me as to the success of treatment. I will get back with her in regard to continuation vs cessation of plavix.   Complete Medication List: 1)  Metformin Hcl 1000 Mg Tabs (Metformin hcl) .... Take 1 by mouth two times a day qd 2)  Glipizide Xl 10 Mg Tb24 (Glipizide) .... Take 1 by mouth qd 3)  Lisinopril 20 Mg Tabs (Lisinopril) .... Take 1 by mouth qd 4)  Simvastatin 40 Mg Tabs (Simvastatin) .... Take 1 by mouth qd 5)  Zolpidem Tartrate 10 Mg Tabs (Zolpidem tartrate) .... Take 1 by mouth qhs 6)  Lasix 40 Mg Tabs (Furosemide) .... Take 1 tablet by mouth once a day and  1 1/2 tab as needed 7)  Plavix 75 Mg Tabs (Clopidogrel bisulfate) .Marland Kitchen.. 1 by mouth once daily 8)  Metoprolol Tartrate 25 Mg Tabs (Metoprolol tartrate) .... 1/2 tab two times a day 9)  Ranitidine Hcl 150 Mg Caps (Ranitidine hcl) .Marland Kitchen.. 1 two times a day 10)  Oxybutynin Chloride 5 Mg Tabs (Oxybutynin chloride) .Marland Kitchen.. 1 by mouth qpm 11)  Aspirin 81 Mg Tabs (Aspirin) .... Take 1 tablet by mouth once a day 12)  Endocet 5-325 Mg Tabs (Oxycodone-acetaminophen) .Marland Kitchen.. 1 by mouth q6 as needed neck pain 13)  Ferrous Sulfate 325 (65 Fe) Mg Tabs (Ferrous sulfate) .Marland Kitchen.. 1 by mouth two times a day  Patient: Jacqueline Lamb Note: All result statuses are Final unless otherwise  noted.  Tests: (1) Lipid Panel (LIPID)   Cholesterol               142 mg/dL                   1-610     ATP III Classification            Desirable:  < 200 mg/dL                    Borderline High:  200 - 239 mg/dL               High:  > = 240 mg/dL   Triglycerides        [H]  161.0 mg/dL                 9.6-045.4     Normal:  <150 mg/dL     Borderline High:  098 - 199 mg/dL   HDL                       11.91 mg/dL                 >47.82   VLDL Cholesterol          32.2 mg/dL                  9.5-62.1   LDL Cholesterol           66 mg/dL                    3-08  CHO/HDL Ratio:  CHD Risk  3                    Men          Women     1/2 Average Risk     3.4          3.3     Average Risk          5.0          4.4     2X Average Risk          9.6          7.1     3X Average Risk          15.0          11.0                           Tests: (2) BMP (METABOL)   Sodium                    137 mEq/L                   135-145   Potassium            [H]  5.2 mEq/L                   3.5-5.1   Chloride                  102 mEq/L                   96-112   Carbon Dioxide            28 mEq/L                    19-32   Glucose              [H]  112 mg/dL                   16-10   BUN                  [H]  35 mg/dL                    9-60   Creatinine           [H]  1.3 mg/dL                   4.5-4.0   Calcium                   8.9 mg/dL                   9.8-11.9   GFR                       44.16 mL/min                >60  Tests: (3) CBC Platelet w/Diff (CBCD)   White Cell Count          7.3 K/uL                    4.5-10.5   Red Cell Count       [L]  3.44 Mil/uL                 3.87-5.11  Hemoglobin           [L]  9.5 g/dL                    16.1-09.6   Hematocrit           [L]  29.4 %                      36.0-46.0   MCV                       85.5 fl                     78.0-100.0   MCHC                      32.4 g/dL                   04.5-40.9   RDW                   [H]  15.1 %                      11.5-14.6   Platelet Count            264.0 K/uL                  150.0-400.0   Neutrophil %              62.2 %                      43.0-77.0   Lymphocyte %              25.7 %                      12.0-46.0   Monocyte %                8.8 %                       3.0-12.0   Eosinophils%              2.5 %                       0.0-5.0   Basophils %               0.8 %                       0.0-3.0   Neutrophill Absolute      4.5 K/uL                    1.4-7.7   Lymphocyte Absolute       1.9 K/uL                    0.7-4.0   Monocyte Absolute         0.6 K/uL                    0.1-1.0  Eosinophils, Absolute                             0.2 K/uL  0.0-0.7   Basophils Absolute        0.1 K/uL                    0.0-0.1  Tests: (4) Hepatic/Liver Function Panel (HEPATIC)   Total Bilirubin           0.5 mg/dL                   6.0-4.5   Direct Bilirubin          0.1 mg/dL                   4.0-9.8   Alkaline Phosphatase      59 U/L                      39-117   AST                       17 U/L                      0-37   ALT                       13 U/L                      0-35   Total Protein             6.5 g/dL                    1.1-9.1   Albumin                   3.9 g/dL                    4.7-8.2  Tests: (5) TSH (TSH)   FastTSH                   0.62 uIU/mL                 0.35-5.50  Tests: (6) UDip w/Micro (URINE)   Color                     LT. YELLOW       RANGE:  Yellow;Lt. Yellow   Clarity                   CLEAR                       Clear   Specific Gravity          1.010                       1.000 - 1.030   Urine Ph                  6.0                         5.0-8.0   Protein                   NEGATIVE                    Negative   Urine Glucose             NEGATIVE  Negative   Ketones                   NEGATIVE                    Negative   Urine Bilirubin           NEGATIVE                     Negative   Blood                     NEGATIVE                    Negative   Urobilinogen              0.2                         0.0 - 1.0   Leukocyte Esterace        SMALL                       Negative   Nitrite                   NEGATIVE                    Negative   Urine WBC                 3-6/hpf                     0-2/hpf   Urine Epith               Few(5-10/hpf)               Rare(0-4/hpf)   Urine Bacteria            Rare(<10/hpf)               NonePrescriptions: ENDOCET 5-325 MG TABS (OXYCODONE-ACETAMINOPHEN) 1 by mouth q6 as needed neck pain  #120 x 0   Entered and Authorized by:   Jacques Navy MD   Signed by:   Jacques Navy MD on 11/19/2009   Method used:   Print then Give to Patient   RxID:   [id number] ZOLPIDEM TARTRATE 10 MG  TABS (ZOLPIDEM TARTRATE) take 1 by mouth qhs  #30 x 5   Entered and Authorized by:   Jacques Navy MD   Signed by:   Jacques Navy MD on 11/19/2009   Method used:   Print then Give to Patient   RxID:   [id number] FERROUS SULFATE 325 (65 FE) MG TABS (FERROUS SULFATE) 1 by mouth two times a day  #180 x 3   Entered and Authorized by:   Jacques Navy MD   Signed by:   Jacques Navy MD on 11/19/2009   Method used:   Electronically to        [facility] (retail)       [address]       [place], [place]  [address]       Ph: [phone]       Fax: [phone]   RxID:   [id number] OXYBUTYNIN CHLORIDE 5 MG TABS (OXYBUTYNIN CHLORIDE) 1 by mouth qPM  #180 x 1   Entered and  Authorized by:   Jacques Navy MD   Signed by:   Jacques Navy MD on 11/19/2009   Method used:   Electronically to        [facility] (retail)       [address]       [place], [place]  [address]       Ph: [phone]       Fax: [phone]   RxID:   [id number] RANITIDINE HCL 150 MG CAPS (RANITIDINE HCL) 1 two times a day  #180 x 3   Entered and Authorized by:    Jacques Navy MD   Signed by:   Jacques Navy MD on 11/19/2009   Method used:   Electronically to        [facility] (retail)       [address]       [place], [place]  [address]       Ph: [phone]       Fax: [phone]   RxID:   [id number] METOPROLOL TARTRATE 25 MG TABS (METOPROLOL TARTRATE) 1/2 tab two times a day  #60 x 3   Entered and Authorized by:   Jacques Navy MD   Signed by:   Jacques Navy MD on 11/19/2009   Method used:   Electronically to        [facility] (retail)       [address]       [place], [place]  [address]       Ph: [phone]       Fax: [phone]   RxID:   [id number] LASIX 40 MG TABS (FUROSEMIDE) Take 1 tablet by mouth once a day and  1 1/2 tab as needed  #135 x 3   Entered and Authorized by:   Jacques Navy MD   Signed by:   Jacques Navy MD on 11/19/2009   Method used:   Electronically to        [facility] (retail)       [address]       [place], [place]  [address]       Ph: [phone]       Fax: [phone]   RxID:   [id number] SIMVASTATIN 40 MG  TABS (SIMVASTATIN) take 1 by mouth qd  #90 x 3   Entered and Authorized by:   Jacques Navy MD   Signed by:   Jacques Navy MD on 11/19/2009   Method used:   Electronically to        [facility] (retail)       [address]       [place], [place]  [address]       Ph: [phone]       Fax: [phone]   RxID:   [id number] LISINOPRIL 20 MG  TABS (LISINOPRIL) take 1 by mouth qd  #90 x 3   Entered and Authorized by:   Jacques Navy MD   Signed by:   Jacques Navy MD on 11/19/2009   Method used:   Electronically to        [facility] (retail)  [address]       [place], [place]  [address]       Ph: [phone]       Fax: [phone]   RxID:   [id number] GLIPIZIDE XL 10  MG  TB24 (GLIPIZIDE) take 1 by mouth qd  #90 x 3   Entered and Authorized by:   Jacques Navy MD   Signed by:   Jacques Navy MD on 11/19/2009   Method used:   Electronically to        [facility] (retail)       [address]       [place], [place]  [address]       Ph: [phone]       Fax: [phone]   RxID:   [id number] METFORMIN HCL 1000 MG  TABS (METFORMIN HCL) take 1 by mouth two times a day qd  #180 x 3   Entered and Authorized by:   Jacques Navy MD   Signed by:   Jacques Navy MD on 11/19/2009   Method used:   Electronically to        [facility] (retail)       [address]       [place], [place]  [address]       Ph: [phone]       Fax: [phone]   RxID:   [id number]

## 2010-12-29 ENCOUNTER — Other Ambulatory Visit: Payer: Self-pay | Admitting: Internal Medicine

## 2011-01-02 ENCOUNTER — Telehealth: Payer: Self-pay | Admitting: *Deleted

## 2011-01-02 MED ORDER — LISINOPRIL 20 MG PO TABS: 20.0000 mg | ORAL_TABLET | Freq: Every day | ORAL | Status: DC

## 2011-01-02 NOTE — Telephone Encounter (Signed)
refill 

## 2011-01-05 ENCOUNTER — Other Ambulatory Visit: Payer: Self-pay | Admitting: Internal Medicine

## 2011-01-29 ENCOUNTER — Other Ambulatory Visit: Payer: Self-pay | Admitting: Internal Medicine

## 2011-02-17 NOTE — Assessment & Plan Note (Signed)
[facility]                            CARDIOLOGY OFFICE NOTE   NAME:Crehan, KLOEE BALLEW                  MRN:          [id number]  DATE:12/02/2007                            DOB:          [birth year]    IDENTIFICATION:  [name] is a 65 year old woman who was last  seen in cardiology back in the end of January.  She has been complaining  of spells of fatigue and went on to have an echocardiogram that was  abnormal worrisome for a restrictive picture.  Note also, the  echocardiogram showed only mild mitral regurgitation.  With all this and  with her known pericardial disease I set the patient up for a cardiac  catheterization.  This was done by Dr. Angelina Sheriff on February 10.  This showed on right heart catheterization a mean RA pressure of 7,  pulmonary capillary wedge pressure mean was 16 with a prominent V-wave.  There was no evidence for restrictive pericarditis.  There was no  equalization of pressures and there was normal respiratory variation.  No dip and plateau seen.   Left heart catheterization showed the left main with a 25% ostial  lesion, the mid LAD was occluded, distal vessel had a 30% stenosis  filling via the RIMA graft.  It was a 1.5-mm vessel.  Circumflex had a  25% stenosis.  The ramus was very large, had a 25% proximal stenosis and  the RCA had no significant disease.  The RIMA to the LAD, though, there  was a 99% anastomotic lesion.   With this, the patient went on to have PTCA/stent to the RIMA to the LAD  (bare-metal stent).   Since having this done, the patient has not had any episodes of severe  fatigue but she just gives out.  She is also complaining of some neck  problems and headache.  She has been seen by neurosurgery in the past  and had neck surgery.  She just is not feeling right.  She does not  think she has had a severe spell of fatigue but she says she just could  not work.   CURRENT MEDICINES:  1.  Metformin 1 g b.i.d.  2. Glipizide 10.  3. Lisinopril 20.  4. Simvastatin 40.  5. Prilosec OTC.  6. Aspirin 81.  7. B12, B6.  8. Vitamin C.  9. Zolpidem.  10.Plavix 75 daily.   PHYSICAL EXAMINATION:  The patient is in no distress.  Blood pressure is  145/76, pulse is 80, weight 204.  LUNGS:  Clear.  CARDIAC EXAM:  Regular rate and rhythm.  S1, S2.  No S3, no murmurs.  ABDOMEN:  Benign.  EXTREMITIES:  No edema.   IMPRESSION:  1. Coronary artery disease.  Note, recent intervention.  She still has      not noticed a marked improvement, though, in her symptoms.  I am      not sure where it is coming from.  Question some deconditioning.  I      think cardiac rehab would be an excellent idea, especially would      give some monitoring.  Unfortunately, for her cost may be an issue      even with insurance.  I will try to look into this for her or she      will need to look into it.  I will keep her on the Plavix.  Again,      I will need to confirm the length of this.  It may be more long-      term given its location.  2. Dyslipidemia on simvastatin.  Would continue.  3. Hypertension.  Blood pressure a little higher than I would like,      but I will follow and not change for now.   I will set to see the patient back later in the summer.  Again,  hopefully we can get her into cardiac rehab.   ADDENDUM:  A 12-lead EKG:  Sinus rhythm, right bundle-branch block, LVH.  Unchanged from previous.     Pricilla Riffle, MD, [facility]  Electronically Signed    PVR/MedQ  DD: 12/02/2007  DT: 12/04/2007  Job #: [id number]   cc:   Rosalyn Gess. Norins, MD

## 2011-02-17 NOTE — Op Note (Signed)
Jacqueline, Lamb           ACCOUNT NO.:  [id number]   MEDICAL RECORD NO.:  [id number]          PATIENT TYPE:  INP   LOCATION:  5154                         FACILITY:  [facility]   PHYSICIAN:  Stefani Dama, M.D.  DATE OF BIRTH:  26-Jul-1946   DATE OF PROCEDURE:  06/07/2007  DATE OF DISCHARGE:                               OPERATIVE REPORT   PREOPERATIVE DIAGNOSIS:  C5-C6 and C6-C7 cervical spondylosis with  herniated nucleus pulposus, cervical radiculopathy, cervical myelopathy.   POSTOPERATIVE DIAGNOSIS:  C5-C6 and C6-C7 cervical spondylosis with  herniated nucleus pulposus, cervical radiculopathy, cervical myelopathy.   PROCEDURE:  Anterior cervical decompression of C5-C6 and C6-C7,  arthrodesis with structural allograft, anterior plate fixation with  Alphatec plate.   SURGEON:  Stefani Dama, M.D.   FIRST ASSISTANT:  Cristi Loron, M.D.   ANESTHESIA:  General endotracheal.   INDICATIONS FOR PROCEDURE:  Jacqueline Lamb is a 65 year old  individual who has had significant neck, shoulder and arm pain.  She has  evidence cervical spondylitic disease plus herniated nucleus pulposus  compressing the left side of her spinal cord and exiting nerve roots.  She had been advised regarding surgical decompression as she has had  significant increase in weakness and pain in that left upper extremity.   PROCEDURE IN DETAIL:  The patient was brought to the operating room and  placed on the table in a supine position. After the smooth induction of  general endotracheal anesthesia, she was placed in 5 pounds halter  traction.  The neck was prepped with DuraPrep and draped in a sterile  fashion.  A transverse incision was made in the left side of the neck  and this was carried down through the platysma.  The plane between the  sternocleidomastoid and strap muscles was dissected bluntly until the  prevertebral space was reached.  The first identifiable disc space was  noted to be  that of C5-C6 on localizing radiograph. Then, the longus  coli muscle was stripped off either side of midline, a self-retaining  retractor was placed in the wound. C5-C6 and C6-C7 were identified and  opened with a 15 blade to open the ventral aspect of the disc space.  A  combination of Kerrison rongeurs was used to evacuate the significantly  desiccated disc material until the region of the posterior longitudinal  ligament was identified.  Dissection was carried out to the lateral  gutters, a high speed bur being used to remove the inferior margin of  the endplates at C5 and superior margin of the endplate at C6.  The  lateral recesses were then cleared in a similar fashion and on the left  side, there was encountered a substantial disc herniation with free disc  material up against the dura on the left side of the cord and the  exiting nerve root.  This was all cleared. Once this was cleared, some  epidural bleeding was encountered and this was contained with some small  pledgets of Gelfoam soaked in thrombin which were later irrigated away.  At C6-C7, a similar procedure was carried out. Here, there was noted be  a large spondylitic ridge from the inferior margin body of C6 and also  the superior margin body of C7.  This was drilled away and taken up with  2 and 3 mm Kerrison punch.  The decompression was taken down to the  lateral gutters.  Hemostasis in the lateral area was achieved with  bipolar cautery and some small pledgets of Gelfoam soaked in thrombin  which were left in the lateral recess.  Once hemostasis was achieved and  decompression was completed, a 7 mm standard size Alphatec graft was cut  to size and shaped into the appropriate position to fit within the disc  space.  This was filled with a combination of the patient's own  autograft mixed with demineralized bone matrix.  It was countersunk  approximately 2 mm.  Ultimately, an anterior plate was fixed with  locking  variable 4 x 14 mm screws, the plate was 34 mm in length.  Hemostasis in the soft tissues was then checked meticulously. The wound  was copiously irrigated with antibiotic irrigating solution.  The  platysma was closed with 3-0 Vicryl in an interrupted fashion.  3-0  Vicryl was used in the subcuticular tissues.  A dry sterile dressing was  placed on the skin.  The patient tolerated the procedure well and went  to the recovery room in stable condition.      Stefani Dama, M.D.  Electronically Signed     HJE/MEDQ  D:  06/07/2007  T:  06/07/2007  Job:  [id number]

## 2011-02-17 NOTE — Assessment & Plan Note (Signed)
[facility]                           PRIMARY CARE OFFICE NOTE   NAME:Jacqueline Lamb, Jacqueline Lamb                  MRN:          [id number]  DATE:03/22/2007                            DOB:          January 02, 1946    Jacqueline Lamb is a 65 year old, Caucasian woman who presents for  followup evaluation and exam. She reports that she has had a little bit  of left shoulder pain which actually is more in the trapezius area which  was acute over the past day but no other active complaints.   PAST MEDICAL HISTORY:   SURGICAL:  1. Cholecystectomy.  2. TAH/BSO with incidental appendectomy.  3. CABG with LIMA to LAD.  4. Mitral valve repair.  5. Left mastectomy.   MEDICAL ILLNESSES:  1. 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  2. Pericarditis.  3. Hypertension.  4. CAD.  5. NIDDM.  6. Peripheral neuropathy lower extremities.  7. Hyperlipidemia.  8. Cervical disk disease by MRI which the patient made a good recovery      without surgery.   GYN:  The patient is a gravida 2, para 2.   CURRENT MEDICATIONS:  1. Lisinopril 20 mg daily.  2. Aspirin 325 mg daily.  3. Glipizide 10 mg daily.  4. Zocor 40 mg q.p.m.  5. Metformin 1000 mg b.i.d.  6. Prilosec OTC q.a.m.  7. Vitamin B6.  8. Vitamin B12.  9. Chromium.  10.Vitamin C.  11.Aleve on a p.r.n. basis.  12.Temazepam on a p.r.n. basis.   FAMILY HISTORY:  Well documented in previous notes.   SOCIAL HISTORY:  The patient's marriage is healthy. Her children are  well. Her husband continues to work at [employer].   CHART REVIEW:  Last stress Cardiolite study was April 2005 rated as a  normal study with an ejection fraction of 53%. The patient had upper  endoscopy September 22, 2000 which was notable for esophageal stricture  and hiatal hernia. The patient has not had colorectal cancer screening.   Last echocardiogram April 27, 2002 which  noted annuloplasty ring to be in  the mitral position. EF was 45-50%. A more recent study from January 04, 2004 again showed an EF of 55%, no functional mitral stenosis is noted,  very mild MR is noted. Annuloplasty ring remains well seated. Last  mammogram from 2007 was unremarkable.   REVIEW OF SYSTEMS:  The patient has had no constitutional, opthalmic,  ENT, cardiovascular, respiratory, GI or GU complaints.   PHYSICAL EXAMINATION:  VITAL SIGNS:  Temperature was 97.6, blood  pressure 134/67, pulse 81, weight 216.  GENERAL:  A heavy-set, Caucasian woman in no acute distress.  HEENT:  Normocephalic, atraumatic. EACs and TMs were unremarkable.  Oropharynx with native dentition without buccal or palatal lesions. The  posterior pharyngeal was clear. Conjunctiva and sclera was clear.  PERRLA. EOMI. Funduscopic exam with hand held instrument was  unremarkable.  NECK:  Supple without thyromegaly.  NODES:  No adenopathy was noted in the cervical or supraclavicular  regions.  CHEST:  No deformities were  noted.  LUNGS:  Clear with no rales, wheezes or rhonchi.  BREASTS:  The patient is status post left total radical mastectomy with  a well healed scar line with no abnormalities. Right breast with normal  skin, nipple without discharge. No fixed mass, lesion or abnormality was  noted.  CARDIOVASCULAR:  2+ radial pulses, no JVD, no carotid bruits. The  patient had a quiet precordium. She had very loud heart sounds secondary  to her mastectomy. She is noted to have an S3 gallop. She has a 3/6  systolic murmur as heard best at the apex.  ABDOMEN:  Obese, soft, no guarding, no rebound. No organosplenomegaly  was appreciated.  PELVIC/RECTAL:  deferred.  EXTREMITIES:  Without clubbing, cyanosis, edema, no deformities are  noted.  MUSCULOSKELETAL:  Exam revealed the patient to have full range of motion  of her left shoulder, no crepitus was noted and there was no click. She  did have tenderness to  palpation at the trapezius on the left.   LABORATORY DATA:  Hemoglobin 11.5 grams, white count 7900 with a normal  differential. Chemistries revealed a serum glucose of 151. Electrolytes  were normal. Renal function normal with a creatinine of 0.7 and an  estimated GFR of 91 mL/minute. Liver functions were normal. Cholesterol  135, triglycerides 76, HDL 41.4, LDL was 78. Hemoglobin A1c was 7.6%.  TSH was normal at 0.82.   ASSESSMENT/PLAN:  1. Hypertension. The patient's blood pressure is well controlled. She      will continue her present medications.  2. Cardiovascular. The patient is to followup with Dr. Dietrich Pates.      Currently she seems to be stable.  3. Hyperlipidemia. Excellent control with her present medical regimen,      she will continue the same.  4. Non-insulin dependent diabetes. Patient is poorly controlled at      this time. She would prefer to have an opportunity for lifestyle      management rather than a change in medications. She will continue      on Glipizide daily and Metformin b.i.d. She will return in 3 months      for an A1c and will make appropriate adjustments at that time if      her hemoglobin A1c is greater than 7%.  5. Irritable bladder. The patient had tried Vesicare with good results      but it was cost prohibitive. Plan:  Oxybutynin 5 mg either once in the evening or b.i.d. based on side  effects.  1. Insomnia. The patient continues to have occasional insomnia and      will continue with temazepam as needed.  2. Obesity. The patient has a significant problem being overweight and      she understands the adverse effect this has on her other underlying      metabolic problems. Plan:  The patient is to adopt a regular      exercise program. Goal would be to loose 1 pound per month or 12      pounds per year with an ideal body weight target for 160 pounds      with a loss of 60 pounds in 5 years 3. Musculoskeletal. Patient with a history of known  cervical disk      disease. Her shoulders are unremarkable with no evidence of      articular derangement. Would be concerned that the patient may be      having pain originating from her cervical disk disease.  If her      symptoms progress particularly if she has loss of sensation,      feeling or strength, would need to consider repeat MRI scan.  4. Health maintenance. The patient is current with mammograms. She      reports she is financially unable to afford a colonoscopy. The      patient's last flex sig was in 2001. Plan:  The patient was given      Hemoccult cards for screening.   In summary, this is a very pleasant patient with multiple medical  problems as outlined above. She does seem medically stable at this time.  She will return the Hemoccult cards at her convenience.     Rosalyn Gess Norins, MD  Electronically Signed    MEN/MedQ  DD: 03/23/2007  DT: 03/23/2007  Job #: [id number]   cc:   Marylu Lund A. Carin Primrose, MD, [facility]

## 2011-02-17 NOTE — Assessment & Plan Note (Signed)
[facility]                            CARDIOLOGY OFFICE NOTE   NAME:CHAMBERLAINVendela, Troung                    MRN:          [id number]  DATE:09/12/2007                            DOB:          12/19/1945    IDENTIFICATION:  [name] is a woman I have followed in the  clinic.  She is 65 years old.  She has a history of mitral valve disease  status post repair as well as CABG with LIMA to the LAD.  I last saw her  in December of last year.  In the interval, she has had neck surgery  this summer.  This has helped significantly with her neck pain.   She comes in today.  She says she just has spells where she gets very  fatigued.  She just gives out.  She is actually not working.  She had  this before her neck surgery.  No association with eating.  Not  associated with any particular activity.  No dizziness.  She says she  just will feel horrible and lies down.   CURRENT MEDICATIONS:  1. Metformin 1 g b.i.d.  2. Glipizide 10.  3. Lisinopril 20.  4. Simvastatin 40.  5. Prilosec OTC.  6. Aspirin 81.  7. B12.  8. B6.  9. Vitamin C.  10.Zolpidem 10 nightly.   PHYSICAL EXAM:  No distress.  Blood pressure 129/75, pulse 89, weight 206 down 10 pounds from a year  ago.  LUNGS:  Clear.  CARDIAC:  Regular rate and rhythm.  S1, S2.  No murmurs.  ABDOMEN:  Benign.  EXTREMITIES:  No edema.   IMPRESSION:  1. Spells.  I am not sure what these represent.  She had just disease      in her left anterior descending and she has an artery graft to it.      I do not think this would be a problem, but will need to review.      She also had her mitral valve surgery and I would recommend an      echocardiogram to reevaluate, and I will be in touch with her.      Continue regimen for now.  Would have low threshold for reviewing a      stress test.  2. Dyslipidemia, on simvastatin.  Note, it has been 6 months since      lipids and they were under good control.   LDL 78, HDL 41.  3. Diabetes.  Hemoglobin A1c was around 7.  Again, higher.   I will be in touch with the patient once I see the test results.  Otherwise, tentative followup for 1 year's time.   ADDENDUM:  A 12-lead EKG, sinus rhythm, 91 beats per minute, right  bundle branch block.  Trivial ST depression in II, which is slightly  different from previous, not diagnostic.  Slight ST sagging in the  lateral leads.  Again, less prominent from a previous.     Pricilla Riffle, MD, [facility]  Electronically Signed    PVR/MedQ  DD: 09/14/2007  DT: 09/14/2007  Job #: [id number]

## 2011-02-17 NOTE — Cardiovascular Report (Signed)
Jacqueline Lamb, Jacqueline Lamb           ACCOUNT NO.:  [id number]   MEDICAL RECORD NO.:  [id number]          PATIENT TYPE:  OIB   LOCATION:  1961                         FACILITY:  [facility]   PHYSICIAN:  Rollene Rotunda, MD, FACCDATE OF BIRTH:  1946-01-03   DATE OF PROCEDURE:  11/15/2007  DATE OF DISCHARGE:  11/15/2007                            CARDIAC CATHETERIZATION   PRIMARY:  Rosalyn Gess. Norins, MD.   CARDIOLOGIST:  Pricilla Riffle, MD, [facility].   PROCEDURE:  Left and right heart catheterization/coronary arteriography.   INDICATIONS:  Evaluate a patient with dyspnea and echo parameters  suggestive of restrictive pericarditis.  The patient also had coronary  disease status post RIMA to the LAD.   PROCEDURE NOTE:  Left heart catheterization performed via right femoral  artery, right heart catheterization performed via right femoral vein.  Both vessels cannulated using anterior wall puncture.  A #4-French  arterial sheath and a #7-French venous sheath were inserted via the  modified Seldinger technique.  Preformed Judkins, pigtail and Swan-Ganz  catheter were utilized.  The patient tolerated the procedure well and  left the lab in stable addition.   RESULTS:   HEMODYNAMIC RESULTS:  RA mean 7, RV 39/2, PA 38/11 with a mean of 24,  pulmonary capillary wedge pressure mean 16 with a prominent V-wave, the  LV is 130/17, AO 142/100, cardiac output/ cardiac index (Fick )  5.6/2.675, (the hemodynamic tracings were not consistent with  restrictive pericarditis).  There was no equalization of pressures.  There was normal respiratory variation.  There was no prominent dip in  plateau.   CORONARIES:  The left main had ostial 25% stenosis.  The LAD had  proximal moderate calcification.  The mid vessel was occluded.  The  distal vessel was a 1.5 caliber vessel, but it did wrap the apex.  There  was distal apical 30% stenosis.  The vessel was seen to fill via the  RIMA graft.  The circumflex in the AV  groove had proximal 25% stenosis,  but was free of high-grade disease.  A ramus intermediate was very large  with proximal 25% stenosis.  The right coronary artery is a large  dominant vessel.  It was normal throughout its course.  PDA was moderate  size and normal.  Posterolateral times one was small and normal.  A RIMA  to the LAD was widely patent.  However, there was an anastomotic 99%  lesion.   LEFT VENTRICULOGRAM:  The left ventriculogram was obtained in the RAO  projection.  The EF was 65% with normal wall motion.   CONCLUSION:  1. Severe single-vessel coronary artery disease in the right internal      mammary artery graft to the left anterior descending artery.  2. Well-preserved left ventricular function.  3. She has minimally elevated right-sided pressures, but no evidence      of restrictive physiology on these tracings.   PLAN:  1. I will review the tracings with colleagues.  2. I will review the films to consider PCI of the anastomotic RIMA to      LAD lesion.      Rollene Rotunda,  MD, [facility]  Electronically Signed     JH/MEDQ  D:  11/15/2007  T:  11/16/2007  Job:  [id number]   cc:   Rosalyn Gess. Norins, MD

## 2011-02-17 NOTE — Discharge Summary (Signed)
Jacqueline Lamb, Jacqueline Lamb           ACCOUNT NO.:  [id number]   MEDICAL RECORD NO.:  [id number]          PATIENT TYPE:  OIB   LOCATION:  6532                         FACILITY:  [facility]   PHYSICIAN:  Veverly Fells. Excell Seltzer, MD  DATE OF BIRTH:  [birth year]   DATE OF ADMISSION:  11/16/2007  DATE OF DISCHARGE:  11/17/2007                         DISCHARGE SUMMARY - REFERRING   DISCHARGE DIAGNOSES:  1. Shortness of breath.  2. Progressive coronary artery disease with abnormal echocardiogram      and cardiopulmonary exercise testing (CPX).  3. Status post bare metal stent to the right internal mammary artery      ring and left anterior descending anastomosis.  4. Hyponatremia.  5. Normocytic anemia.  6. Hyperglycemia with a history of diabetes.  7. History as listed previously.   PROCEDURES PERFORMED:  1. Cardiac catheterization on November 15, 2007, by Dr. Antoine Poche.  2. Bare metal stenting to the right internal mammary artery and right      coronary artery anastomosis by Dr. Excell Seltzer on November 16, 2007.   HISTORY OF PRESENT ILLNESS:  Jacqueline Lamb is a 65 year old female who  follows Dr. Tenny Craw in the clinic.  She was seen on November 04, 2007, and  was set up for an outpatient echocardiogram and cardiopulmonary stress  test to evaluate fatigue.  Given the findings of the echocardiogram, it  was felt that catheterization should be performed to further evaluate.   PAST MEDICAL HISTORY:  1. Mitral valve repair.  2. Bypass surgery.  3. Breast cancer, status post mastectomy and radiation treatment in      1982.  4. Pericarditis.  5. Diabetes.  6. Hyperlipidemia.  7. Hypertension.  8. Neuropathy.   LABORATORY DATA AND X-RAY FINDINGS:  EKGs during this admission showed  normal sinus rhythm, left axis deviation, LVH with repolarization  changes, right bundle branch block.  Subsequent EKGs were the same.   On February 11, H&H was 11.8 and 34.4, normal indices, platelets 274,  WBC 8.1.  Prior  to discharge on February 12, H&H was 10.4 and 29.8,  normal indices, platelets 240, WBC 8.3.  Admission sodium was 133,  potassium 5.0, BUN 18, creatinine 0.84, glucose 272.  Prior to discharge  on February 12, sodium was 133, potassium 4.2, BUN 12, creatinine 0.71,  glucose 188.  CK-MB and troponin post procedure were within normal  limits.   HOSPITAL COURSE:  Apparently, the patient had outpatient catheterization  on November 15, 2007, by Dr. Antoine Poche that showed EF of 65% with single  vessel coronary artery disease and recommended PCI to the LAD.  On  November 16, 2007, Dr. Excell Seltzer performed bare metal stenting to the RIMA  to the LAD.  He recommended aspirin indefinitely and Plavix x30 days.  Postprocedure catheterization site was intact and by February 12, she  was ambulating without difficulty.  Catheterization site intact.  Dr.  Excell Seltzer reviewed and felt that the patient could be discharged home.   DISPOSITION:  The patient is discharged home.   WOUND CARE:  Per supplemental sheet.   ACTIVITY:  Per supplemental sheet.   DIET:  She was asked to  maintain low salt, heart-healthy, ADA diet.   DISCHARGE MEDICATIONS:  1. Plavix 75 mg daily x30 days.  2. Nitroglycerin 0.4 as needed.  3. Resume her metformin on Saturday morning.  4. Aspirin 81 mg daily.  5. Glipizide 10 mg daily.  6. Lisinopril 20 mg daily.  7. Simvastatin 40 mg nightly.  8. Prilosec over-the-counter as previously.  9. B-12, B-6 and vitamin D as previously.  10.Zolpidem 10 mg as previously.   SPECIAL INSTRUCTIONS:  She was asked to bring all medications to all  followup appointments.   FOLLOW UP:  She will see Dr. Tenny Craw on December 02, 2007, at 11:45 a.m.  and Dr. Debby Bud as needed.   Discharge time 35 minutes.      Joellyn Rued, PA-C      Veverly Fells. Excell Seltzer, MD  Electronically Signed    EW/MEDQ  D:  11/17/2007  T:  11/17/2007  Job:  [id number]   cc:   Pricilla Riffle, MD, [facility]  Rosalyn Gess. Norins, MD

## 2011-02-17 NOTE — Assessment & Plan Note (Signed)
[facility]                            CARDIOLOGY OFFICE NOTE   NAME:Shimamoto, KIMIMILA TAUZIN                  MRN:          [id number]  DATE:05/28/2008                            DOB:          11-29-1945    IDENTIFICATION:  Jacqueline Lamb is a 65 year old woman who I have  followed in clinic.  She was last seen in cardiology clinic back in May.  She is status post CABG and mitral valve repair.   Note, her last cardiac catheterization was back in February.  Wedge  pressure at that time was 16 that was prominent V-wave.  No evidence for  restrictive physiology.  There was an anastomotic narrowing 99% in the  ramus to LAD graft.  The patient underwent intervention.   Jacqueline Lamb already has said she really has done better, though she  is not back to baseline since we have gone up on her Lasix to 40 daily,  initially it was 40 every other day, then 40 daily.   Note, her BNP was minimally elevated at 304.  She still feels as though  when she pushes it, she will pay the next day or two, having to rest,  but her activity has gone up to walking a mile per day.   Her current medicines include metformin 1 g b.i.d., glipizide 10 daily,  lisinopril 20, simvastatin 40, aspirin 81, Lasix 40, and anti-reflux  meds.   PHYSICAL EXAMINATION:  GENERAL:  The patient is in no distress at rest.  VITAL SIGNS:  Her blood pressure 116/68, pulse is 88, and weight 199.  NECK:  JVP is normal.  LUNGS:  Without rales.  CARDIAC:  Regular rate and rhythm.  S1 and S2.  No murmurs.  ABDOMEN:  Benign without hepatomegaly.  EXTREMITIES:  No edema.   IMPRESSION:  1. Dyspnea.  Pressures overall did not look bad.  RA pressure of 7,      wedge pressure of 16, I do not have the other numbers available.      BNP was up a little bit.  I have increased her Lasix.  She has felt      better, but not back to baseline.  She appears to have some      diastolic dysfunction.  I told her she  could try taking 60 of Lasix      every few days along with the 40s to see how she feels.  Again, she      should take extra potassium and food for this.  She will call back      at the end of next week to allow [place] know how she is doing with this      change.  Otherwise, I would keep on the same medicines that she is      doing.  2. Dyslipidemia.  Continue on statin.   I will set to see the patient back later this winter.     Pricilla Riffle, MD, [facility]  Electronically Signed    PVR/MedQ  DD: 05/28/2008  DT: 05/29/2008  Job #: [id number]

## 2011-02-17 NOTE — Cardiovascular Report (Signed)
Jacqueline Lamb, Jacqueline Lamb           ACCOUNT NO.:  [id number]   MEDICAL RECORD NO.:  [id number]          PATIENT TYPE:  OIB   LOCATION:  6532                         FACILITY:  [facility]   PHYSICIAN:  Veverly Fells. Excell Seltzer, MD  DATE OF BIRTH:  July 25, 1946   DATE OF PROCEDURE:  11/16/2007  DATE OF DISCHARGE:  11/17/2007                            CARDIAC CATHETERIZATION   PROCEDURE:  Percutaneous transluminal coronary angioplasty and stenting  of the left internal mammary artery right internal mammary artery to  left anterior descending anastomosis.   INDICATIONS:  [name] is a 65 year old diabetic woman who has  undergone previous mitral valve surgery and single-vessel bypass with a  RIMA to the LAD.  I believe the RIMA was chosen because of previous  radiation to the left chest for breast cancer.  She has had ongoing  symptoms that led to cardiac catheterization which was performed  yesterday.  Her diagnostic catheterization demonstrated nonobstructive  disease in the left circumflex and right coronary artery.  Her LAD is  occluded.  The RIMA to LAD had a severe stenosis involving the  anastomotic site.  The percent stenosis was at least 95% and the vessel  was quite small.  We elected to proceed with PCI with at least balloon  angioplasty and possibly stenting as the vessel turned out to be big  enough.   Risks and indications of procedure were reviewed in detail with the  patient.  Informed consent was obtained.  The right groin was prepped,  draped and anesthetized with 1% lidocaine using modified Seldinger  technique.  A 6-French sheath was placed in the right femoral artery.  A  0.035 inch J-wire was advanced into the right subclavian artery with a 5-  [place] JR-4 diagnostic catheter.  The catheter was advanced out into the  subclavian artery and then changed out for an exchange length wire.  A 6-  [place] LIMA guide catheter was used.  It was fairly difficult to  selectively  engage the RIMA with the guide catheter but ultimately I was  able to wire the vessel.  Ultimately I was able to select the RIMA and  pass a wire down the vessel.  Angiomax was used for anticoagulation.  A  Cougar guidewire was advanced into the mid portion of the vessel.  This  helped the guide selectively engage into the vessel.  Once the wire was  down into the body of the RIMA, I was able to take some angiographic  images that demonstrated the severe stenosis well.  That image was used  for road map and the Cougar guidewire was passed easily beyond the area  of severe stenosis into the apical portion of the LAD.  The lesion was  then predilated with a 1.5 x 12-mm Voyager balloon which was taken to 10  atmospheres.  Following balloon dilatation there was improvement in the  appearance but there remained with severe stenosis.  I thought the  vessel was of borderline size to take a stent, but the balloon  angioplasty result was unacceptable with severe residual stenosis.  I  elected to stent the vessel with  a 2.0 x 12 mm mini Vision stent which  was deployed at 12 atmospheres.  Following stenting there was a  significant waste in the mid portion of the stent but reasonable stent  expansion throughout the remaining portions.  I postdilated the stent  with a 2 x 8-mm Quantum Maverick balloon which was taken to 16  atmospheres at the distal portion of the stent and 20 atmospheres at the  more proximal portion of the proximal and mid portion of the stent.  Following postdilatation, there was improvement in stent expansion with  no significant residual stenosis.  The patient tolerated the entire  procedure well.  Final angiographic images showed that there were no  problems with the RIMA and TIMI-2 flow throughout the LAD.  The sheath  was left in place and will be removed in 2 hours after discontinuation  of Angiomax X.  There were no immediate complications.   CONCLUSION:  Successful  percutaneous transluminal coronary angioplasty  and stenting of the right internal mammary artery to the left anterior  descending anastomosis with a bare-metal stent.   Recommend indefinite aspirin and at least 30 days of Plavix.      Veverly Fells. Excell Seltzer, MD  Electronically Signed     MDC/MEDQ  D:  11/16/2007  T:  11/18/2007  Job:  [id number]   cc:   Pricilla Riffle, MD, [facility]

## 2011-02-17 NOTE — Assessment & Plan Note (Signed)
[facility]                            CARDIOLOGY OFFICE NOTE   NAME:Lamb, Jacqueline ANSCHUTZ                  MRN:          [id number]  DATE:02/24/2008                            DOB:          12-Jul-1946    IDENTIFICATION:  [name] is a 65 year old woman with a history  of coronary artery disease and mitral valve disease (status post CABG  and repair).  I last saw her back in February.  She had, had an  echocardiogram prior to that visit that suggested a restrictive  physiology, and I went ahead and set her up for a right and left heart  catheterization.  This showed a mean RA pressure of 7, pulmonary  capillary wedge pressure of 16 with prominent V-wave.  No evidence for  restrictive pericarditis.  She went on to have a left heart  catheterization that showed a 25% left main, LAD was occluded.  The  distal vessel filled via a RIMA graft.  There was a 99% anastomotic  lesion.  The circumflex had a 25% stenosis.  The ramus had a 25%  stenosis.  The RCA had no significant disease.  The patient underwent  PTCA stent (bare metal) to the RIMA to the LAD.   I saw her in February.  She had not noticed marked improvement in her  symptoms, still complaining of fatigue.  I went ahead and scheduled her  for follow-up.   In the interval, she has stopped her Plavix.  She says she just gives  out.  She has episodes of chest pressure.  She says she actually has a  little chest pressure now.  It is relatively constant, does go away  though, there for about half a day.   She occasionally takes Lasix for fluid.   CURRENT MEDICINES:  1. Metformin 1 gm b.i.d.  2. Glipizide 10.  3. Lisinopril 20.  4. Simvastatin 40.  5. Aspirin.  6. Calcium with D.  7. Vitamin D.  8. Fortical nasal spray.   PHYSICAL EXAMINATION:  GENERAL:  The patient is in no distress.  VITAL SIGNS:  Blood pressure 128/75, pulse not taken.  Weight 195, down  from 204 on last visit.   LUNGS:  Clear with some rhonchi.  CARDIAC:  Regular rate and rhythm, S1-S2, no murmurs.  ABDOMEN:  Benign.  No hepatomegaly.  EXTREMITIES:  No significant edema.   Based on her lung exam, I scheduled for a chest x-ray today with the  rhonchi.  I question if I was missing any pulmonary process.  Chest x-  ray showed no infiltrate.  No evidence for CHF.   IMPRESSION:  1. Fatigue, dyspnea, chest pressure, still not improved.  I have      reviewed her echocardiogram.  I have discussed her case with Dr.      Excell Seltzer, who performed the intervention.  For now, I would like to      check labs.  I will be in touch with her with the results.  2. The patient is quite distraught about payment.  She still has a      significant bill  from the procedure, and I told her I would clarify      with [facility].  3. Dyslipidemia, continue on simvastatin.   I will be in touch with the patient once I have seen the test results.     Pricilla Riffle, MD, [facility]  Electronically Signed    PVR/MedQ  DD: 02/28/2008  DT: 02/28/2008  Job #: [id number]

## 2011-02-17 NOTE — Assessment & Plan Note (Signed)
[facility]                            CARDIOLOGY OFFICE NOTE   NAME:CHAMBERLAINMikhaila, Lamb                    MRN:          [id number]  DATE:11/04/2007                            DOB:          14-Aug-1946    IDENTIFICATION:  [name] is a 65 year old woman who I have  followed in clinic.  She has a history of mitral valve disease is status  post repair, coronary artery disease, status post CABG (LIMA to LAD).  She also has a history of breast cancer, is status post mastectomy and  XRT in 1982 (left breast).  She does have a history of pericarditis.   When I saw the patient last in December, she was having spells of  getting very fatigued.  She was not actually working.  I went ahead and  set her up for cardiopulmonary stress test and an echocardiogram.  The  echocardiogram was done on December 30.  It showed mild thickening of  the mitral valve.  There was mild regurgitation.  LV function was low  normal with 55% with mild hypokinesis of the inferoseptal wall.  Doppler  parameters were consistent with a restrictive physiology.  Estimated PA  pressure by TR jet was 58 mmHg.   The patient also again had a cardiopulmonary stress test done in mid  December, and this showed mild to moderate functional limitation that  appeared multifactorial per report with markedly flattened O2 pulse,  primary limitation related to circulatory impairment, some mild  ventilatory limitation.   I have discussed everything and actually Dr. Nicholes Mango is familiar  with the findings and recommendations to catheterize the patient.   Medications when the patient was seen in December include:  1. Metformin 1 g b.i.d.  2. Glipizide 10 mg.  3. Lisinopril 20 mg.  4. Simvastatin 40 mg.  5. Prilosec OTC.  6. Aspirin 81 mg.  7. B12.  8. B6.  9. Vitamin C  10.Zolpidem 10 mg.   PAST MEDICAL HISTORY:  1. Mitral valve disease.  2. CAD, status post CABG.  3. Breast  cancer, status post XRT, status post mastectomy.  4. Pericarditis.  5. Hypertension.  6. Diabetes.  7. Neuropathy   SOCIAL HISTORY:  The patient is married.  She does not smoke, does not  drink.   FAMILY HISTORY:  Mother with CHF.   REVIEW OF SYSTEMS:  All systems negative to the above problem except as  noted above.   Physical exam on 12/08 showed a blood pressure 125/75, pulse 89, weight  206, which was down from 216 ONE year prior.  The patient was in no distress.  Denied shortness of breath at rest.  HEENT:  Normocephalic, atraumatic.  EOMI.  PERRL.  Mucous membranes were  moist.  NECK:  JVP was normal.  LUNGS:  Clear.  No rales.  CARDIAC:  Regular rate and rhythm, S1 AND S2.  No definite murmurs.  ABDOMEN:  Supple, nontender.  No hepatomegaly.  EXTREMITIES:  No edema.  2+ pulses.   IMPRESSION:  1. Dyspnea.  I have recommended a right and left heart catheterization  to define the filling pressures, see if there is any evidence for      constriction and also define her anatomy.  We will go ahead and set      this up for the next couple of weeks with Dr. Gala Romney.  Continue      medicines except hold the metformin and glipizide prior to the      procedure.     Pricilla Riffle, MD, [facility]  Electronically Signed    PVR/MedQ  DD: 11/04/2007  DT: 11/05/2007  Job #: [id number]

## 2011-02-20 NOTE — Discharge Summary (Signed)
[place]  Patient:    Jacqueline Lamb, Jacqueline Lamb Visit Number: [id number] MRN: [id number]          Service Type: MED Location: 2000 2004 01 Attending Physician:  Charlett Lango Dictated by:   Adair Patter, P.A. Admit Date:  03/27/2002 Discharge Date: 04/14/2002   CC:         [facility]   Discharge Summary  ADMITTING DIAGNOSES:  Chest pain.  SECONDARY DIAGNOSES: 1. Type 2 diabetes mellitus. 2. Hypertension. 3. Postoperative anemia, secondary to blood loss. 4. Postoperative volume overload.  DISCHARGE DIAGNOSIS:  Coronary artery disease.  HOSPITAL COURSE:  The patient was admitted to [facility] on March 27, 2002 secondary to experiencing chest pain.  Because of this she was seen and evaluated by cardiologist.  She underwent cardiac catheterization, which revealed significant coronary artery disease amenable to surgical correction.  She also had a 2-D echocardiogram performed, which revealed that she had mitral regurgitation.  Dr. Salvatore Decent. Dorris Fetch was consulted.  Prior to undergoing surgical intervention a dental consultation was obtained.  On April 03, 2002 the patient underwent a mitral valve repair and a coronary artery bypass graft x1, with right internal mammary artery anastomosed to the left anterior descending artery.  No complications were noted during the procedure.  Postoperatively the patient had hospital course complicated by volume overload.  This was treated with aggressive diuresis, loop diuretics.  She was anticoagulated with Coumadin.  Adequate control of her diabetes was obtained with Glucotrol sliding-scale insulin.  Postoperatively she also had experience of persistent elevated white blood cell count.  Because of this, she was started on broad-spectrum antibiotics for several days.  GI consultation was obtained.  It was felt the patient had acute ischemic injury causing her GI upset and  elevated white blood cell count.  Despite these problems, the patient continued to make slow progress and was subsequently discharged in stable condition on April 13, 2002.  DISCHARGE MEDICATIONS:  1. Niferex 150 mg one tablet daily.  2. Colace 100 mg one tablet twice daily.  3. Glucotrol XL 10 mg one daily.  4. Ultram 50 mg 1-2 tablets q.4-6h. p.r.n. pain.  5. Coumadin (her final Coumadin dose will be dictated by her INR at     the time of discharge.)  ACTIVITY:  The patient was told no driving, strenuous activity or lifting heavy objects.  DIET:  As tolerated.  WOUND CARE:  The patient was told she could shower and clean her incision with soap and water.  DISPOSITION:  To home.  FOLLOW-UP:  The patient is told to see Dr. Dorris Fetch on May 03, 2002 at 11:45 a.m.  She is also told to call her cardiologist for a 2-week appointment. Dictated by:   Adair Patter, P.A. Attending Physician:  Charlett Lango DD:  04/13/02 TD:  04/17/02 Job: [id number]

## 2011-02-20 NOTE — Procedures (Signed)
[place]  Patient:    Jacqueline Lamb, Jacqueline Lamb Visit Number: [id number] MRN: [id number]          Service Type: MED Location: 2300 2301 01 Attending Physician:  Charlett Lango Dictated by:   Bedelia Person, M.D. Proc. Date: 04/03/02 Admit Date:  03/27/2002                             Procedure Report  PROCEDURES PERFORMED:  Transesophageal echocardiogram.  INDICATIONS:  The patient is a 65 year old female with known mitral insufficiency and newly diagnosed coronary artery disease, scheduled for mitral valve repair or replacement and coronary artery bypass grafting.  DESCRIPTION OF THE PROCEDURE:  After induction of general anesthesia the airway was secured using an oral endotracheal tube. The transesophageal echocardiography probe was heavily lubricated and passed down the oropharynx to the 45 cm mark. There it was manipulated throughout the remainder of the case to obtain various views, as well as left in the neutral unflexed position during the bypass. The probe was removed at the completion of the case with no evidence of oral or pharyngeal damage.  The prebypass examination revealed a normal to very slightly thickened left ventricle. There were no segmental defects. Contractility was good. The left atrium was slightly enlarged. The mitral valve appeared normal in appearance and function. It appeared to be coapting well. There may have been a slight restriction on the posterior leaflet. There was no calcium detected.  Color Doppler revealed 4+ mitral valve regurgitation in a broad central regurgitant pattern. The pulmonary veins, however, showed no reversal of flow. The aortic valve again appeared normal. The leaflets appeared to be coapting well. There were three leaflets. Color Doppler revealed a mild central regurgitant pattern. The tricuspid valve also appeared normal in appearance with thin, freely mobile leaflets. The color Doppler  revealed mild to moderate tricuspid regurgitation.  The patient was placed on cardiopulmonary bypass and underwent ring valvuloplasty to the mitral valve and coronary artery bypass grafting to the LAD. At the completion of bypass, there were numerous air bubbles which were removed with the left ventricular vent. The post bypass examination revealed essentially no air bubbles. Left ventricular function appeared initially overall decreased in contractility. Dopamine was started and contractility improved. There was still some anterior wall akinetic activity noted. This again was improving on time after bypass and on dopamine.  The mitral valve ring appeared to be well seated. The size was measured at 24. There was 1+ mitral valve regurgitation in a central pattern with the ring in place on color Doppler. The aortic valve was essentially unchanged with a trace to mild central regurgitant pattern, as well as the same for the tricuspid valve, again unchanged from prebypass examination at a mild to moderate regurgitant flow pattern. Dictated by:   Bedelia Person, M.D. Attending Physician:  Charlett Lango DD:  04/03/02 TD:  04/04/02 Job: [id number]

## 2011-02-20 NOTE — Op Note (Signed)
NAME:  Jacqueline Lamb, Jacqueline Lamb                     ACCOUNT NO.:  [id number]   MEDICAL RECORD NO.:  [id number]                   PATIENT TYPE:  INP   LOCATION:  2004                                 FACILITY:  [facility]   PHYSICIAN:  Charlynne Pander, D.D.S.          DATE OF BIRTH:  12-28-1945   DATE OF PROCEDURE:  03/31/2002  DATE OF DISCHARGE:  04/14/2002                                 OPERATIVE REPORT   PREOPERATIVE DIAGNOSES:  PROBLEM #1:  Severe mitral regurgitation.  PROBLEM #2:  Coronary artery disease.  PROBLEM #3:  Pre heart valve surgery dental protocol.  PROBLEM #4:  Acute irreversible pulpitis of tooth #30.  PROBLEM #5:  Necrotic pulp, #30.   POSTOPERATIVE DIAGNOSES:  PROBLEM #1:  Severe mitral regurgitation.  PROBLEM #2:  Coronary artery disease.  PROBLEM #3:  Pre heart valve surgery dental protocol.  PROBLEM #4:  Acute irreversible pulpitis of tooth #30.  PROBLEM #5:  Necrotic pulp, #30.   OPERATIONS PERFORMED:  PROCEDURE #1:  Dental examination.  PROCEDURE #2:  Root canal therapy of tooth #30.   SURGEONS:  Charlynne Pander, D.D.S., Tilda Burrow, D.D.S.   ASSISTANT:  J. Hollifield, dental assistant, R. Joseph Art, Sales executive.   ANESTHESIA:  Monitored anesthesia care per the anesthesia team.   MEDICATIONS:  MEDICATION #1:  Antibiotic prophylaxis for root canal therapy  as part of a subacute bacterial endocarditis antibiotic prophylaxis regimen.  MEDICATION #2:  Local anesthesia with total utilization of three carpules  each containing 2% mepivacaine with 1:20,000 levonordefrin, as well as one  carpule containing 4% articaine and 1:100,000 epinephrine.   SPECIMENS:  None.   DRAINS/CULTURES:  None.   ESTIMATED BLOOD LOSS:  Negligible.   FLUIDS:  100 mL of lactated Ringer's solution.   COMPLICATIONS:  None.   INDICATIONS FOR PROCEDURE:  The patient had significant mitral regurgitation  and coronary artery disease and was planned to have a mitral valve  replacement along with a coronary artery bypass graft with Dr. Dorris Fetch.  The patient was examined and the treatment planned for a root canal therapy  of tooth #30. This treatment plan was formulated to decrease the risks and  complications associated with dental infection affecting the patient's  systemic health and future heart valve surgery.   OPERATIVE FINDINGS:  The patient was examined in the operating room. Tooth  #30 was identified for the root canal therapy. The patient was noted to be  affected by a history of irreversible pulpitis symptoms, as well as having a  necrotic pulp. These operative findings which contributed to significant  risks for dental disease to affect  the patient's systemic health and  anticipated heart surgery.   DESCRIPTION OF PROCEDURE:  The patient was brought to the main operating  room. The patient was placed in the supine position on the operating room  table. Monitored anesthesia care was induced per the anesthesia team. The  patient was then prepped and draped  in the manner for root canal therapy.  The oral cavity was thoroughly examined with the findings as noted above.  The patient was then ready for the root canal therapy. This root canal  therapy was to be provided by Dr. Meda Klinefelter (endodontist).   Local anesthesia was administered over the hour long procedure with a total  utilization of three carpules each containing 2%  mepivacaine with 1:20,000  levonordefrin. The patient also was given 1 carpule containing 4% articaine  with 1:100,000 epinephrine. Again this was administered over the one hour  long procedure.   Tooth #30 was first approached. Anesthesia was delivered as previously  described. A rubber dam was placed. Access was made to expose the root canal  systems of tooth #30. The tooth was noted to be necrotic at this time. The  tooth had three canals which were identified. The root canal therapy then  proceeded with  debridement and cleaning and shaping of the root canals.   The canals were appropriately irrigated with sodium hypochlorite. The canals  were then filled appropriately with guttapercha as indicated. A cotton  pellet was placed at this time. Temporary restoration was then placed. The  patient was advised to follow up with private dentist for evaluation of a  definitive restoration.   The root canal therapy was complete at this time. The patient was  then  handed over to the anesthesia team for final disposition. After an  appropriate amount of time the patient was taken to the post anesthesia care  unit with stable vital signs and good oxygenation levels.                                               Charlynne Pander, D.D.S.    RFK/MEDQ  D:  08/22/2002  T:  08/22/2002  Job:  [id number]   cc:   Salvatore Decent. Dorris Fetch, M.D.  [address]  [place]  [place] [address]  Fax: [phone]   Tilda Burrow, D.D.S.  [address]  [place]  [place] [address]  Fax: [phone]
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
## 2011-02-20 NOTE — Consult Note (Signed)
**Note [place]-Identified via Obfuscation** [place]. [facility]  Patient:    Jacqueline [place], Jac[place]eline Lamb Visit Number: [id number] MRN: [id number]          Service Type: MED Location: [phone] Attending Physician:  Pricilla Riffle Dictated by:   Cindra Eves, D.D.S. Proc. Date: 03/29/02 Admit Date:  03/27/2002   CC:         Viviann Spare C. Dorris Fetch, M.D.  Dietrich Pates, M.D. [facility]  Maisie Fus D. Riley Kill, M.D. [facility]  Meda Klinefelter (endodontist)   Consultation Report  DATE OF BIRTH:  03-09-1946  REQUESTING PHYSICIAN:  Salvatore Decent. Dorris Fetch, M.D.  [name] is a 65 year old white female referred by Dr. Charlett Lango for a dental consultation.  The patient was admitted with abnormal stress test and chest pain on March 27, 2002.  The patient subsequently underwent cardiac catheterization with Dr. Shawnie Pons on March 29, 2002 which revealed coronary artery disease, severe mitral regurgitation, and aortic insufficiency.  The patient with anticipated mitral valve replacement along with a coronary artery bypass graft with Dr. Charlett Lango.  Dental consultation requested to rule out dental infection prior to anticipated heart valve surgery as well as to evaluate a history of recent tooth ache.  MEDICAL HISTORY: 1. Coronary artery disease.    a. Status post cardiac catheterization on March 29, 2002 which revealed       coronary artery disease, severe mitral regurgitation, and 1-2+ aortic       insufficiency.    b. Anticipated coronary artery bypass graft with Dr. Dorris Fetch. 2. Severe mitral regurgitation.    a. Anticipated mitral valve replacement along with coronary artery bypass       graft as above. 3. Unstable angina. 4. Hypertension. 5. Diabetes mellitus type 2. 6. History of left breast cancer status post mastectomy and radiation therapy    in 1982. 7. Status post hysterectomy in 1988. 8. Status post appendectomy in 1988. 9. Status post cholecystectomy in  1973.  ALLERGIES/ADVERSE DRUG REACTIONS:  None known.  MEDICATIONS: 1. Aspirin 325 mg daily. 2. Glucotrol 10 mg daily. 3. Procardia 30 mg daily. 4. Maxzide 75/50 daily. 5. Regular Humulin per sliding scale. 6. Heparin per IV protocol.  SOCIAL HISTORY:  The patient is married.  The patient lives in [place] and works in a [employer].  The patient is a nonsmoker, nondrinker.  FAMILY HISTORY:  Mother is alive at the age of 87 with a weak heart.  Father is deceased at the age of 47.  FUNCTIONAL ASSESSMENT:  The patient remains independent for ADLs.  REVIEW OF SYSTEMS:  This is reviewed with patient and is included in the dental consultation record.  DENTAL HISTORY  CHIEF COMPLAINT:  "I have a tooth ache."  HISTORY OF PRESENT ILLNESS:  The patient gives a history of having a filling performed on a lower right premolar (tooth #29) in January 2003.  This was performed by Dr. Jule Ser in [place], [place].  The patient had persistent tooth ache symptoms and the restoration was replaced with a temporary filling on March 20, 2002.  The pain persisted and the patient was referred to an endodontist (Dr. Orvan Falconer) on March 22, 2002.  Testing was performed with no definitive treatment rendered due to inability to isolate whether the tooth was #29 or #30 as the offending tooth.  Dr. Orvan Falconer felt that this most likely was tooth #30, but that a definitive root canal should be performed only after the tooth is isolated.  The patient subsequently then  went and had a stress test which was positive and the patient was subsequently admitted.  The patient then had a cardiac catheterization with Dr. Riley Kill on March 29, 2002 with identification of the coronary artery disease and mitral valve disease.  There is an unsure reasoning that the tooth pain is related to the heart disease but most likely this does represent tooth pain.  The patient currently is unable to identify whether  it is tooth #29 or #30.  The patient indicates that she has a "full-blown tooth ache."  The patient describes the pain as being 10 and when it hurts it is of a 10/10 in intensity.  The patient indicates that the pain lasts "forever."  The patient indicates that she does have spontaneous pain.  The patient indicates that she does get relief with narcotic pain medication but that the pain returns after the medicine wears off.  The patient usually sees Dr. Laural Benes approximately two times per year.  The patient was last seen in the fall of 2002 for a dental cleaning.  The patient does use his amoxicillin 2.0 g as part of an American Heart Association guideline premedication for the prevention of subacute bacterial endocarditis. The patient has been using this regimen for "years."  DENTAL EXAMINATION:  GENERAL:  The patient is a well-developed, well-nourished white female in some distress secondary to the tooth ache.  NECK:  There is no palpable lymphadenopathy.  There are no acute TMJ symptoms.  HEENT:  Intraoral examination:  The patient has normal saliva.  The patient has very good oral hygiene.  There is no evidence of abscess formation. Dentition:  The patient has a generally intact dentition.  Tooth #29 appears to have a temporary restoration in place.  Periodontal:  The patient with very good oral hygiene.  Periodontal charting was deferred secondary to need for antibiotic premedication.  The patient most likely has incipient periodontitis.  Endodontic:  The patient with acute irreversible pulpitis symptoms associated with a right lower quadrant tooth.  This most likely represents tooth pain from tooth #29 or #30.  The patient is unable to determine the exact tooth which hurts her the most.  At this time there is no  sensitivity to percussion or palpation.  The patient has been previously evaluated by an endodontist as per introductory statement and history of present illness.   Crown and bridge:  The patient has multiple crown and bridge restorations.  Prosthodontic:  The patient has no dentures.  Occlusion:  The patient appears to have a stable occlusion.  Radiographic interpretation:  A panoramic x-ray was taken on March 30, 2002.  The patient has a generally intact dentition.  The patient has multiple amalgam and crown and bridge restorations.  There are no obvious periapical radiolucencies noted.  The dark area at the apex of tooth #29 appears to represent the mental nerve foramen.  ASSESSMENT: 1. Acute irreversible pulpitis symptoms associated with right lower quadrant    tooth.  I am unable to determine the etiology as being either #29 or #30 at    this time.  Will seek subsequent evaluation by the endodontist for    definitive care. 2. Very good oral hygiene with minimal plaque accumulations. 3. Evidence of chronic periodontitis with incipient bone loss. 4. There is a temporary restoration associated with tooth #29 at this time.    Definitive restoration will be delivered once the etiology of the tooth    pain is identified. 5. The occlusion is stable  at this time. 6. There are multiple dental restorations which appear to be clinically    acceptable at this time. 7. Need for antibiotic premedication prior to invasive dental procedures. 8. Need to discontinue heparin therapy prior to invasive dental procedures.  PLAN/RECOMMENDATIONS: 1. I discussed the risks, benefits, and complications and various treatment    options with the patient in relationship to her medical and dental    conditions, anticipated heart valve surgery, and risk for subacute    bacterial endocarditis.  We discussed no treatment, root canal therapy on    tooth #29 and/or #30, extraction of tooth #29 and/or #30, alveoloplasty as    indicated, periodontal therapy, dental restorations, root canal therapy,    crown and bridge therapy, implant therapy, and the replacement of missing     teeth as indicated.  The patient currently wishes to proceed with    endodontic therapy by the endodontist (Dr. Meda Klinefelter).  This is    pending final determination by the cardiologist and cardiovascular thoracic    surgeon to determine the medical ability/stability of the patient to    undergo dental procedures at this time. 2. Provided written and verbal information on "Heart Valves and Mouth Care." 3. Will seek referral for root canal therapy with Dr. Meda Klinefelter as    indicated.  Most likely this will take place in the operating room with    monitored anesthesia care as indicated due to the patients medical    instability at this time. 4. Discussion of findings with Dr. Dietrich Pates (cardiologist), Dr. Shawnie Pons (cardiologist), and Dr. Charlett Lango (CVTS).  Will determine    the medical ability/stability of the patient to undergo dental procedures    at this time as well as the need for antibiotic premedication and the    ability to discontinue heparin therapy prior to these procedures. Dictated by:   Cindra Eves, D.D.S. Attending Physician:  Pricilla Riffle DD:  03/31/02 TD:  04/02/02 Job: [id number]

## 2011-02-20 NOTE — Cardiovascular Report (Signed)
[place]. [facility]  Patient:    [place], [place] Visit Number: [id number] MRN: [id number]          Service Type: MED Location: [phone] Attending Physician:  Pricilla Riffle Dictated by:   Arturo Morton Riley Kill, M.D. [facility] Proc. Date: 03/29/02 Admit Date:  03/27/2002   CC:         CV Laboratory  Dietrich Pates, M.D. [facility]  Rosalyn Gess. Norins, M.D. [facility]   Cardiac Catheterization  INDICATIONS: The patient is a 65 year old female who has presented with progressive shortness of breath. She has also had some chest discomfort. She has a long history of mitral regurgitation, had an echocardiogram in 2001, which demonstrated moderate mitral regurgitation but with elevated pulmonary artery pressures. The current study was done to assess coronary anatomy.  PROCEDURES: 1. Left and right heart catheterization. 2. Selective coronary arteriography. 3. Selective left ventriculography. 4. Aortic root aortography.  DESCRIPTION OF PROCEDURE: The patient was brought to the catheterization lab and prepped and draped in the usual fashion. Through an anterior puncture the right femoral artery was easily entered. A 6 French sheath was placed. Views of the left and right coronary arteries were obtained as well as left ventriculography. Pressures were made in the left ventricle.  The left ventricular pressures were marked elevated and because of this we elected to do a right heart catheterization. Right heart catheterization was performed through the right femoral vein using an 8 French sheath and 7.5 French thermodilution Swan-Ganz catheter. She tolerated this well without difficulty. Simultaneous pressures were measured in the LV and pulmonary capillary wedge. An aortic root shot was also obtained to evaluate aortic regurgitation which was 1+ on the previous echocardiogram. All catheters were subsequently removed.  She was taken to the holding area for direct hemostasis.  She tolerated the procedure without complication.  HEMODYNAMIC DATA: 1. Central aortic pressure 129/80, mean 100. 2. Left ventricle 126/16/33. 3. Right atrium 24 mean. 4. Right ventricle 67/28. 5. Pulmonary artery 65/35. 6. Pulmonary capillary wedge 37, V 49. 7. No aortic left ventricular gradient. 8. No significant mitral valve gradient.  ANGIOGRAPHIC DATA: 1. Ventriculography was performed in the RAO projection. Overall, systolic    function was preserved.  Ejection fraction was calculated at 54% but    angiographically appeared to be higher than that. There was 3 to 4+ mitral    regurgitation, emanating into the pulmonary veins. 2. The aortic root demonstrated normal size of the aortic root. There was 1+    mitral regurgitation. 3. The left main coronary had some tapered ostial narrowing of about 20-30%. 4. The left anterior descending artery was calcified. After the first    diagonal, there was a segmental area of disease of about 70 culminating in    the 75% hazy stenosis in the mid vessel. The distal vessel wrapped the    apical tip. 5. There was a ramus intermedius that had some mild luminal irregularity    near the ostium but no high-grade area of narrowing. 6. The AV circumflex was relatively small but free of critical disease. 7. The right coronary artery was a dominant vessel and appears to be smooth    throughout.  CONCLUSIONS: 1. Severe mitral regurgitation. 2. Mild aortic regurgitation. 3. 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 4. Patent internal mammary vessel.  DISPOSITION: A surgical consultation will be obtained. The patient had significant pulmonary hypertension. She may need a transesophageal echocardiogram prior to surgery. Dictated  by:   Arturo Morton Riley Kill, M.D. [facility] Attending Physician:  Pricilla Riffle DD:  03/29/02 TD:  03/30/02 Job: [id number]

## 2011-02-20 NOTE — Op Note (Signed)
[place]. [facility]  Patient:    Jacqueline Lamb, Jacqueline Lamb Visit Number: [id number] MRN: [id number]          Service Type: MED Location: CCUB [id number] Attending Physician:  Charlett Lango Dictated by:   Salvatore Decent. Dorris Fetch, M.D. Proc. Date: 04/03/02 Admit Date:  03/27/2002   CC:         Dietrich Pates, M.D. [facility]  Rosalyn Gess. Norins, M.D. [facility]   Operative Report  PREOPERATIVE DIAGNOSIS:  Severe mitral regurgitation and single vessel coronary disease.  POSTOPERATIVE DIAGNOSIS:  Severe mitral regurgitation and single vessel coronary disease.  OPERATION PERFORMED:  Median sternotomy, extracorporeal circulation.  Coronary artery bypass grafting times one (right internal mammary artery to left anterior descending), mitral valve repair with 24 Seguin annuloplasty ring.  SURGEON:  Salvatore Decent. Dorris Fetch, M.D.  ASSISTANT:  Sherrie George, P.A.  ANESTHESIA:  General.  OPERATIVE FINDINGS:  Significant radiation changes secondary to previous mastectomy and radiation.  Sternum with mild osteoporosis.  Dense intrapericardial adhesions.  LAD 1.5 mm good quality target.  Right mammary artery good quality.  Mitral valve with mild posterior leaflet restriction. Thickening of papillary muscle, no significant chordal thickening.  Prebypass transesophageal echocardiography revealed severe mitral regurgitation. Postbypass trace mitral regurgitation.  No change in 1 to 2+ aortic insufficiency and mild tricuspid insufficiency.  INDICATIONS FOR PROCEDURE:  The patient is a 65 year old white female.  She presented with progressive shortness of breath and some intermittent chest discomfort.  She has a long history of known mitral regurgitation.  She recently had an echocardiogram which showed moderate to severe mitral regurgitation with elevated pulmonary artery pressures.  She also had a Cardiolite which was positive for an inferior wall defect.  She underwent cardiac  catheterization which revealed single vessel coronary disease with a 75% LAD stenosis and also revealed severe mitral regurgitation with pulmonary hypertension with a PA pressure of 64/35 with a wedge of 37.  Her ejection fraction was 54%.   The patient was referred for consideration for mitral valve repair or replacement and possible coronary artery bypass grafting.  The indications, risks, and benefits were discussed the patient. She understood that there was increased risk of wound complications due to her previous mastectomy and radiation therapy.  The transesophageal echocardiogram preoperatively showed a central mitral regurgitant jet with possible mild leaflet restriction.  There was no prolapse.  DESCRIPTION OF PROCEDURE:  The patient was brought to the preop holding area on April 03, 2002.  Lines were placed to monitor arterial, central venous and pulmonary arterial pressure.  EKG leads were placed for continuous telemetry. The patient was taken to the operating room, anesthetized and intubated.  A Foley catheter was placed.  Intravenous antibiotics were administered.  The chest, abdomen and legs were prepped and draped in the usual fashion.  The patients prior mastectomy wound was marked.  A median sternotomy was performed.  The skin incision was biased to the right side.  It was carried down to the level of the previous mastectomy incision which crossed the midline and that incision was followed laterally on the right.  The sternum was opened in the standard fashion with an oscillating saw in the midline. The right internal mammary artery was harvested using standard technique.  It was a good quality graft with excellent flow and divided distally.  The patient was fully heparinized prior diving the distal end of the mammary artery.  The pericardium was incised.  There were extensive intrapericardial adhesions. Also the pericardium and  heart were tethered superiorly in the left  chest and the left pleural space was opened which did help this but with still with increased difficulty with visualization of the mitral valve.  The intrapericardial adhesions were taken down over the aorta and the right atrium.  After confirming adequate ACT, the aorta was cannulated via concentric 2-0 Ethibond pledgeted pursestring sutures.  The superior vena cava was dissected out and it was cannulated with a 31 French right angle metal tip venous cannula via pursestring suture.  Partial bypass was instituted with flows of 2 to 3 liters per minute and then a 40 [place] plastic tip right angle venous cannula was placed via pursestring suture in the inferior aspect of the right atrium and directed into the inferior vena cava.  The umbilical tapes with Rumel tourniquets were placed around both cavae but were only tightened during the mitral portion of the procedure. A retrograde cardioplegia cannula was placed in the coronary sinus after taking down the remainder of the intrapericardial adhesions which were quite dense particularly anteriorly. Positioning of the coronary sinus catheter was confirmed with palpation of the tip as well as a wedge pressure tracing with balloon inflation.  An antegrade cardioplegia cannula was placed in the ascending aorta.  The aorta was crossclamped after the patient was cooled to 32 degrees Celsius. The left ventricle was emptied via the aortic root.  Vent cardiac arrest was then achieved with combination of cold antegrade and retrograde blood cardioplegia and topical iced saline.  Initial cardioplegia was given antegrade to achieve a diastolic arrest.  It was stopped intermittently to allow evacuation of the left ventricle as there was some mild aortic insufficiency.  After achieving complete diastolic arrest, the remainder of the cardioplegia was administered via the retrograde catheter to achieve adequate septal cooling.  The aortic root vent then was  left on to decompress  the left ventricle.  The umbilical tapes were tightened.  The interatrial groove was dissected out and a left atriotomy was made.  The atrial retractor was placed exposing the mitral valve.  The mitral valve leaflets were inspected.  There was no significant leaflet pathology although the posterior leaflet was somewhat restricted by the papillary muscles which appeared thickened and foreshortened.  The cords themselves appeared normal.  There was no rheumatic change.  Decision was made to proceed with ring annuloplasty to allow coaptation of the valve leaflets.  2-0 Ethibond annular sutures then were placed circumferentially.  The annulus then was sized for a 24 Seguin annuloplasty ring sizing off both the area of the anterior leaflet as well as the intercommissural distance.  The sutures then were placed through the sewing ring of the Seguin annuloplasty ring.  It was lowered into place. Sutures were tied sequentially.  The valve then was inspected.  Iced saline was injected into the left ventricle.  The valve held an excellent pressure with no demonstrable leakage.  A Foley catheter was left across the valve for deairing.  The left atriotomy then was closed in two layers with running 4-0 Prolene horizontal mattress suture followed by a running 4-0 Prolene simple suture.  After completing the second suture line, deairing maneuvers were performed.  The balloon was deflated and the Foley catheter was withdrawn into the left atrium and additional deairing maneuvers were performed.  The catheter then was withdrawn and the sutures were tied.  Additional cardioplegia was administered to maintain myocardial septal temperature of less than 15 degrees Celsius throughout the procedure.  Next the  right internal mammary artery was inspected.  It had adequate length to reach the LAD.  The distal end of the right mammary was spatulated.  It was a 1.5 mm good quality vessel,  excellent flow.  The LAD was a 1.5 mm good quality vessel.  There was palpable atherosclerotic plaque correlating with the findings on cardiac catheterization.  The right mammary to LAD anastomosis then was performed with a running 8-0 Prolene suture in an end-to-side fashion.  At the completion of the anastomosis, the bulldog clamp was removed from the right mammary artery.  There was good hemostasis.  There was immediate and rapid septal rewarming.  Lidocaine was administered.  The mammary pedicle was tacked to the epicardial surface of the heart with 6-0 Prolene sutures.  The aortic crossclamp was removed.  The total crossclamp time was 91 minutes. The patient resumed a rhythm spontaneously and did not require defibrillation.  The patient was rewarmed during the mammary to LAD anastomosis.  The retrograde cardioplegia cannula was removed.  The superior vena cava cannula was repositioned into the right atrium and the inferior cannula was removed.  A 3-0 Pledgeted Prolene suture was used to reinforce the cannulation site because of bleeding.  There was then good control of the bleeding.  The heart was then allowed to fill with blood.  While visualizing the echocardiogram there was a small amount of air in the left ventricle which was evacuated with a 16 gauge Angiocath placed into the apex.  The aortic root vent was left on at low suction.  However, there was excellent deairing.  The lungs were inflated.  The patient was weaned from cardiopulmonary bypass without difficulty.  She was on a low dose dopamine drip.  Transesophageal echocardiography as the patient was weaned from bypass revealed no change in aortic or tricuspid insufficiency.  There was excellent seating of the valve ring with good motion of the leaflets possibly still some mild posterior restriction and only trace mitral insufficiency at the completion of bypass. The patients initial cardiac index was low after coming off  bypass with resuscitation with volume and low dose dopamine drip resulted in cardiac index of greater than 2L per minute per meter squared.  The patient then remained hemodynamically stable throughout the postbypass period.  The superior vena cava cannula was removed.  There was good hemostasis at the cannulation site.  After no air had been visualized for 15 minutes, the aortic root vent was removed.  After a test dose of protamine, the aortic cannula was removed.  There was good hemostasis at this site as well.  The remainder of the protamine was administered without incident.  The chest was irrigated with 1L of warm normal saline containing 1 gm of vancomycin.  Hemostasis was achieved.  A right pleural and two mediastinal chest tubes were placed through separate subcostal incisions.  The sternum was closed with interrupted heavy gauge stainless steel wires.  The pectoralis fascia was closed with a running #1 Vicryl suture.  The subcutaneous tissue was closed with a running 2-0 Vicryl suture and the skin was closed with a 3-0 Vicryl subcuticular suture. Sponge, needle and instrument counts were correct at the end of the procedure. There were no intraoperative complications.  The patient was taken from the operating room to the surgical intensive care unit in critical but stable condition. Dictated by:   Salvatore Decent Dorris Fetch, M.D. Attending Physician:  Charlett Lango DD:  04/03/02 TD:  04/05/02 Job: [id number]

## 2011-02-20 NOTE — Consult Note (Signed)
[place]. [facility]  Patient:    Jacqueline Lamb, Jacqueline Lamb Visit Number: [id number] MRN: [id number]          Service Type: MED Location: [phone] Attending Physician:  Pricilla Riffle Dictated by:   Jacqueline Lamb, M.D. Proc. Date: 03/29/02 Admit Date:  03/27/2002   CC:         Jacqueline Lamb, M.D. [facility]  Jacqueline Lamb, M.D. [facility]  Jacqueline Lamb, M.D. [facility]   Consultation Report  REASON FOR CONSULTATION:  Consider for mitral valve and coronary artery bypass surgery.  CHIEF COMPLAINT:  Progressive shortness of breath.  HISTORY OF PRESENT ILLNESS:  Jacqueline Lamb is a 65 year old female with a known history of mitral valve disease which was first diagnosed in the 63s and has been followed.  She has not particularly had any significant symptoms related to that and, in 2001, she had a 2+ mitral regurgitation.  She went to see Dr. Debby Bud recently for a routine checkup and mentioned at that time that she had not been feeling well for several weeks.  She had had increasing fatigue.  She was having progressive dyspnea on exertion with some substernal chest tightness.  She had an echocardiogram done followed by a stress Cardiolite.  The stress Cardiolite was positive for an inferior wall defect, and the patient was admitted to the hospital.  Today, she underwent cardiac catheterization which showed single-vessel coronary disease with a 75% left main stenosis, pulmonary hypertension with PA pressures of 64/35 with a pulmonary capillary wedge pressure of 37.  She had mild aortic insufficiency, moderate to severe mitral insufficiency.  Her ejection fraction was 54%.  Of note, the patient for the past week has been complaining of a toothache.  She has been having difficulty with her tooth for several months now and has had at least two different procedures on that tooth but, over the past week, she has had constant pain.  She does have peripheral edema.   She gets shortness of breath with exertion and rarely at rest.  PAST MEDICAL HISTORY: 1. Diabetes, non-insulin-dependent, type 2. 2. Hypertension. 3. History of mastectomy and radiation in 1982 for breast cancer. 4. Cholecystectomy and hysterectomy.  MEDICATIONS AT TIME OF ADMISSION: 1. Glucotrol XL 10 mg p.o. q.d. 2. Adalat 30 mg p.o. q.d. 3. Maxzide 75/50 p.o. q.d. 4. Prevacid 15 mg p.o. q.d. 5. Valerian 2 p.o. q.h.s. 6. vitamin B q.d. 7. Restoril 30 mg p.o. q.h.s. p.r.n. 8. Vicodin p.r.n.  ALLERGIES:  She has no known drug allergies.  FAMILY HISTORY:  Significant for her mother, who died at age 10 of a weak heart.  SOCIAL HISTORY:  She works as a [employer] manager.  She lives with her husband. She also is the primary caretaker for her mother-in-law.  She has no history of tobacco abuse nor ethanol abuse.  REVIEW OF SYSTEMS:  She complains of shortness of breath, dyspnea on exertion, paroxysmal nocturnal dyspnea.  No orthopnea.  She has no history of bleeding or bruising easily.  She had no history of stroke or TIA.  No history of deep venous thrombosis.  She does complain of tooth pain.  She denies any fevers, chills, or sweats.  PHYSICAL EXAMINATION:  GENERAL:  Jacqueline Lamb is a 65 year old white female.  She is obese and in no acute distress.  She is well-developed and well-nourished.  VITAL SIGNS:  Blood pressure is 140/70, pulse 85 and regular, respirations 12.  NEUROLOGIC:  She is intact with  no focal deficits.  HEENT:  Unremarkable.  She appears to have good dentition but has had multiple fillings.  NECK:  Without thyromegaly, adenopathy, or bruits.  CHEST:  She has had a previous mastectomy with scarring on the left side.  The skin appears normal.  CARDIAC:  Regular rate and rhythm with a 3/6 systolic murmur at the left lower sternal border with radiation to the apex.  LUNGS:  Diminished breath sounds at the bases.  ABDOMEN:  Obese, soft,  nontender.  EXTREMITIES:  Without clubbing, cyanosis, or edema.  She has 2+ distal pulses bilaterally.  LABORATORY DATA:  CPK was 76, MB 2.0, troponin 0.05.  White count 10.6, hematocrit 36, platelets 251.  Cholesterol was 120, triglycerides 120, HDL 40. PT 14.3, PTT on heparin 114.  BUN and creatinine are 17 and 1.0.  Glucose was 202 on admission.  Chest x-ray showed congestive heart failure.  EKG showed sinus rhythm with right bundle-branch block and left axis deviation.  She also has evidence of LDH.  IMPRESSION:  Jacqueline Lamb is a 65 year old white female with known mitral regurgitation.  This has worsened over the past two years.  She now has become symptomatic with acute congestive failure.  Cardiac catheterization today reveals 3-4+ mitral regurgitation, ejection fraction of approximately 50%, and single-vessel coronary disease with 75% mid left anterior descending lesion. I have not been able to review the echocardiogram or the report from the echocardiogram done Monday, but I suspect she likely has mitral valve prolapse as the etiology of her mitral regurgitation.  If so, mitral valve repair will likely be successful.  I want to avoid the need for lifelong anticoagulation. I also believe that she should have bypass grafting to the left anterior descending, particularly given her diabetes.  The only issue is whether the left mammary will be usable.  It might be preferable to use the right mammary given her previous history of radiation and previous surgery to the left breast.  She has been seen in consultation by Dr. Kristin Bruins, regarding her tooth pain.  An orthopantogram has been ordered for tomorrow morning, and we will await the results for dental evaluation and see what might need to be  done about her constant toothache prior to proceeding with her mitral valve surgery.  We potentially could do her surgery Friday morning if she does not have contraindication from a  dental standpoint. Dictated by:   Jacqueline Lamb, M.D. Attending Physician:  Pricilla Riffle DD:  03/29/02 TD:  03/31/02 Job: [id number]

## 2011-02-20 NOTE — H&P (Signed)
[place]. [facility]  Patient:    Jacqueline Lamb, Jacqueline Lamb Visit Number: [id number] MRN: [id number]          Service Type: Attending:  Dietrich Pates, M.D. [facility] Dictated by:   Guy Franco, P.A., [facility] Adm. Date:  03/27/02                           History and Physical  CHIEF COMPLAINT:   Chest pain.  HISTORY OF PRESENT ILLNESS:  Jacqueline Lamb is a 65 year old female with no known history of coronary artery disease, but she does describe a history of progressive substernal chest pain, dyspnea on exertion over the past two years.  Over the past few weeks, this has been worse and accompanied by fatigue, PND, as well as a toothache.  She has seen two dentists over the past week that can find no cause for her toothache.  She saw her primary care physician, Dr. Debby Bud, for these symptoms, and he referred her for a Cardiolite.  The preliminary results today reveal inferior wall defect.  With her progressive symptoms and ongoing toothache as well as chest pain, she will be promptly admitted to [facility] for urgent catheterization.  PAST MEDICAL HISTORY:   Includes diabetes on oral agents.  Hypertension treated.  She is status post cholecystectomy and hysterectomy, status post mastectomy with radiation therapy in 1982.  She did have a 2-D echocardiogram performed in November 2001 which revealed mild aortic sclerosis with mild aortic regurgitation, moderate MR, moderate TR with RV systolic pressure elevated at 50 to 60 mmHg consistent with moderate to severe pulmonary hypertension.  ALLERGIES:  No known drug allergies.  MEDICATIONS: 1. Glucotrol XL 10 mg a day. 2. Adalat 30 mg a day. 3. Maxzide 75/50 one q.d. 4. Prevacid 15 mg a day. 5. Valorin 2 tablet q.h.s. (over-the-counter drug). 6. Vitamin B q.d. 7. Restoril 30 mg a day at bedtime as needed. 8. Vicodin p.r.n.  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  Denies any tobacco, alcohol, or illicit drug use.  She follows a diabetic diet.  FAMILY HISTORY:  Mother age 54, "weak heart."  Dad died at age 80 after surgery secondary to hemorrhage.  REVIEW OF SYSTEMS:  As above, otherwise negative.  PHYSICAL EXAMINATION:  VITAL SIGNS:  Pulse 101, respirations 16, blood pressure 143/76.  HEENT:  Grossly normal.  NECK:  No carotid bruits.  No JVD or thyromegaly.  CHEST:  Clear to auscultation bilaterally.  No wheezing or rhonchi.  HEART:  Regular rate and rhythm with 2/6 systolic murmur at the left sternal border, no diastolic component.  SKIN:  Warm and dry.  ABDOMEN:  Good bowel sounds.  Nontender, nondistended.  EXTREMITIES:  No lower extremity edema.  DIAGNOSTIC DATA:  EKG today reveals normal sinus rhythm with T wave inversion in V1 and V2.  ASSESSMENT: 1. Unstable angina. 2. Systolic murmur with no mitral regurgitation by echocardiogram in    February 2001. 3. Diabetes mellitus, oral agents. 4. Hypertension.  PLAN:  The patient will be admitted, placed on IV heparin, IV nitroglycerin, and aspirin.  Will plan for urgent cardiac catheterization today once lab work gets back.  The patient was seen and examined by Dr. Dietrich Pates. Dictated by:   Guy Franco, P.A., [facility] Attending:  Dietrich Pates, M.D. [facility] DD:  03/27/02 TD:  03/27/02 Job:  [id number]

## 2011-02-20 NOTE — Letter (Signed)
March 10, 2010     RE:  Jacqueline Lamb, Jacqueline Lamb  MRN:  [id number]  /  DOB:  [birth year]   To Whom It may Concern:   I am writing on behalf of Jacqueline Lamb, DOB August 17, 1946, in  regards to her application for disability.   It is my medical and professional opinion that [name], due to  her multiple medical problems including known coronary artery disease,  valvular disease, diabetes, degenerative disk disease of the cervical  spine and peripheral neuropathy, is definitely unable to work.   Full documentation of these diagnoses is available through our her  medical records, which we will make available to you upon appropriate  request and release by the patient.   I think [name] has made an effort to manage her medical  problems by following our advice and adhering to her medical regimen.  However, the combination of all her medical problems makes it such that  she is unable to work on a regular basis.   If I can provide any additional information in support of her  application for disability, please do not hesitate to contact me.   I remain yours truly,    Sincerely,      Rosalyn Gess. Norins, MD  Electronically Signed   MEN/MedQ  DD: 03/10/2010  DT: 03/10/2010  Job #: [id number]

## 2011-02-20 NOTE — Assessment & Plan Note (Signed)
[facility]                            CARDIOLOGY OFFICE NOTE   NAME:Lamb, Jacqueline BOBST                  MRN:          [id number]  DATE:09/06/2006                            DOB:          Jan 26, 1946    IDENTIFICATION:  Jacqueline Lamb is a 65 year old woman who I last saw  in cardiology clinic actually way back in August 2006.  She is normally  followed by Wyonia Hough.  Has a history of mitral valve disease status  post annuloplasty, coronary artery disease status post LIMA to LAD (June  2003), also noted by Dr. 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   The patient was seen by Wyonia Hough recently and actually told him she  did not tolerate the beta blocker, she got some swelling.  She has used  Lasix periodically over the past 6 months, not associated with any  particular events, she just gets winded.  Felt like she was retaining  fluid again; it was a half a Lasix.  Says she is watching her salt.   CURRENT MEDICATIONS:  1. Lisinopril 20 daily.  2. Baby aspirin 81 mg daily.  3. Glipizide 10 daily.  4. Zocor 40 q.h.s.  5. Metformin 1 g b.i.d.  6. Prilosec OTC.  7. Vitamin B, vitamin B6, vitamin B12, chromium, vitamin C.  8. Vesicare 5.   PHYSICAL EXAMINATION:  GENERAL:  The patient is in no distress.  VITAL SIGNS:  Blood pressure today 151/84, pulse is 94, weight 216.  LUNGS:  Clear, no wheezes or rales.  CARDIAC:  Regular rate and rhythm, S1, S2.  Positive S4, grade 1-2/6  diastolic rumble heard best at the apex.  ABDOMEN:  Benign.  EXTREMITIES:  No edema.   A 12-lead EKG shows sinus rhythm, right bundle-branch block, left  anterior fascicular block, LVH with strain pattern.  Cannot completely  exclude ischemia.  Note the patient has had profound EKG changes.  Note  she is status post mastectomy.   IMPRESSION:  Examination today:  1. Mitral valve disease.  Again, we know she  has some tethering of the      mitral valve on echocardiogram.  I would recommend repeating to      compare.  I do not think there is significant stenosis and again,      her evaluation today goes against this.  I am not sure with the      periodic Lasix if she has had some sort of salt indiscretion, has      retained fluid, or what.  I again would wait the echocardiogram and      discuss with her.  She will get this after the holidays.  2. Hypertension.  Blood pressure today is a little higher than it has      been in other clinics.  I would follow for now.  Tentatively I will      set for her to followup in 1 year, but I will be in touch      with her once I have seen the echocardiogram on where to  proceed.  3. Dyslipidemia, on Zocor.  Last lipid panel in May shows good      control.     Pricilla Riffle, MD, [facility]  Electronically Signed    PVR/MedQ  DD: 09/06/2006  DT: 09/07/2006  Job #: [id number]   cc:   Rosalyn Gess. Norins, MD

## 2011-03-10 ENCOUNTER — Other Ambulatory Visit: Payer: Self-pay | Admitting: Internal Medicine

## 2011-04-09 ENCOUNTER — Other Ambulatory Visit: Payer: Self-pay | Admitting: Internal Medicine

## 2011-04-20 ENCOUNTER — Telehealth: Payer: Self-pay

## 2011-04-20 MED ORDER — OXYCODONE-ACETAMINOPHEN 5-325 MG PO TABS
1.0000 | ORAL_TABLET | Freq: Four times a day (QID) | ORAL | Status: DC | PRN
Start: 2011-04-20 — End: 2011-08-07

## 2011-04-20 NOTE — Telephone Encounter (Signed)
Ok to [facility] a prescription if they don't want to honor the Dec '11 Rx.

## 2011-04-20 NOTE — Telephone Encounter (Signed)
[facility] called to verify if it's ok to fill Rx for Percocet 5-325 mg because pt brought in a script that was dated 09/04/2010. Please advise

## 2011-04-20 NOTE — Telephone Encounter (Signed)
Pharmacy notified ok to fill. Rx added to pt's med list

## 2011-06-03 ENCOUNTER — Other Ambulatory Visit: Payer: Self-pay | Admitting: Internal Medicine

## 2011-06-26 LAB — CBC
MCHC: 34.2
MCHC: 34.8
MCV: 89.1
Platelets: 274
RBC: 3.83 — ABNORMAL LOW
RDW: 13
RDW: 13.1

## 2011-06-26 LAB — BASIC METABOLIC PANEL
BUN: 12
BUN: 18
CO2: 24
CO2: 26
Calcium: 8.5
Calcium: 9
Chloride: 102
Creatinine, Ser: 0.71
Creatinine, Ser: 0.84
GFR calc Af Amer: >60
Glucose, Bld: 188 — ABNORMAL HIGH
Glucose, Bld: 272 — ABNORMAL HIGH

## 2011-06-26 LAB — POCT I-STAT 3, ART BLOOD GAS (G3+)
Acid-base deficit: 2
Bicarbonate: 22.2
O2 Saturation: 98
Operator id: 141321

## 2011-06-26 LAB — POCT I-STAT 3, VENOUS BLOOD GAS (G3P V)
Acid-base deficit: 3 — ABNORMAL HIGH
O2 Saturation: 71
pO2, Ven: 38

## 2011-06-26 LAB — CARDIAC PANEL(CRET KIN+CKTOT+MB+TROPI)
Relative Index: INVALID
Troponin I: 0.01

## 2011-07-17 LAB — BASIC METABOLIC PANEL
Calcium: 9.6
Creatinine, Ser: 0.77
GFR calc Af Amer: >60
GFR calc non Af Amer: >60
Glucose, Bld: 218 — ABNORMAL HIGH
Sodium: 136

## 2011-07-17 LAB — CBC
Hemoglobin: 12.9
RBC: 4.11
RDW: 13.8

## 2011-08-06 ENCOUNTER — Other Ambulatory Visit: Payer: Self-pay | Admitting: *Deleted

## 2011-08-06 NOTE — Telephone Encounter (Signed)
Ok for refill  x3 

## 2011-08-06 NOTE — Telephone Encounter (Signed)
Pt called requesting refill of Oxycodone-acetaminophen for her neck pain. She takes 1 tablet every 6 hours as needed. Last got 120 on 04/20/11. Please advise if ok to refill and quantity.

## 2011-08-07 MED ORDER — OXYCODONE-ACETAMINOPHEN 5-325 MG PO TABS: 1.0000 | ORAL_TABLET | Freq: Four times a day (QID) | ORAL | Status: DC | PRN

## 2011-08-07 NOTE — Telephone Encounter (Signed)
Informed pt to pick up prescriptions

## 2011-12-01 ENCOUNTER — Other Ambulatory Visit: Payer: Self-pay | Admitting: Internal Medicine

## 2012-01-02 ENCOUNTER — Other Ambulatory Visit: Payer: Self-pay | Admitting: Internal Medicine

## 2012-01-04 ENCOUNTER — Other Ambulatory Visit: Payer: Self-pay | Admitting: Internal Medicine

## 2012-01-04 NOTE — Telephone Encounter (Signed)
Done for 30-day supply only; last OV 10.13.2011

## 2012-01-06 NOTE — Telephone Encounter (Signed)
DENIED-Last refills 03.30.13/SLS

## 2012-01-30 ENCOUNTER — Other Ambulatory Visit: Payer: Self-pay | Admitting: Internal Medicine

## 2012-02-03 ENCOUNTER — Other Ambulatory Visit: Payer: Self-pay | Admitting: Internal Medicine

## 2012-02-09 ENCOUNTER — Telehealth: Payer: Self-pay | Admitting: *Deleted

## 2012-02-09 NOTE — Telephone Encounter (Signed)
Received fax pt requesting refill on oxybutynin 5 mg take 1 by mouth in the evening. Last filled 11/19/09. Med is not on med list. Is this ok to renew?Marland Kitchen... 02/09/12@2 :49pm/LMB

## 2012-02-10 MED ORDER — OXYBUTYNIN CHLORIDE 5 MG PO TABS: 5.0000 mg | ORAL_TABLET | Freq: Every evening | ORAL | Status: DC

## 2012-02-10 NOTE — Telephone Encounter (Signed)
Sent renewal abck to [facility]... 02/10/12@3 :33pm/LMB

## 2012-02-10 NOTE — Telephone Encounter (Signed)
Ok to renew?  

## 2012-02-22 ENCOUNTER — Other Ambulatory Visit: Payer: Self-pay | Admitting: Internal Medicine

## 2012-03-01 ENCOUNTER — Other Ambulatory Visit: Payer: Self-pay | Admitting: Internal Medicine

## 2012-03-16 ENCOUNTER — Other Ambulatory Visit: Payer: Self-pay | Admitting: Internal Medicine

## 2012-03-16 NOTE — Telephone Encounter (Signed)
Rx REFILL SENT TO [facility] FOR L[facility]OR WITH NOTE OF NEED TO MAKE APPT.

## 2012-04-19 ENCOUNTER — Encounter: Payer: Self-pay | Admitting: Internal Medicine

## 2012-04-19 ENCOUNTER — Ambulatory Visit (INDEPENDENT_AMBULATORY_CARE_PROVIDER_SITE_OTHER): Payer: Self-pay | Admitting: Internal Medicine

## 2012-04-19 ENCOUNTER — Other Ambulatory Visit (INDEPENDENT_AMBULATORY_CARE_PROVIDER_SITE_OTHER): Payer: Self-pay

## 2012-04-19 VITALS — BP 110/60 | HR 86 | Temp 99.8°F | Resp 16 | Wt 164.0 lb

## 2012-04-19 DIAGNOSIS — E785 Hyperlipidemia, unspecified: Secondary | ICD-10-CM

## 2012-04-19 DIAGNOSIS — D649 Anemia, unspecified: Secondary | ICD-10-CM

## 2012-04-19 DIAGNOSIS — E119 Type 2 diabetes mellitus without complications: Secondary | ICD-10-CM

## 2012-04-19 DIAGNOSIS — I2510 Atherosclerotic heart disease of native coronary artery without angina pectoris: Secondary | ICD-10-CM

## 2012-04-19 LAB — CBC WITH DIFFERENTIAL/PLATELET
Basophils Relative: 0.4 % (ref 0.0–3.0)
Eosinophils Absolute: 0 10*3/uL (ref 0.0–0.7)
Lymphocytes Relative: 11.2 % — ABNORMAL LOW (ref 12.0–46.0)
MCHC: 33.3 g/dL (ref 30.0–36.0)
Neutrophils Relative %: 78.3 % — ABNORMAL HIGH (ref 43.0–77.0)
RBC: 3.56 Mil/uL — ABNORMAL LOW (ref 3.87–5.11)
WBC: 9.4 10*3/uL (ref 4.5–10.5)

## 2012-04-19 LAB — LIPID PANEL
HDL: 44.7 mg/dL (ref >39.00)
VLDL: 46.4 mg/dL — ABNORMAL HIGH (ref 0.0–40.0)

## 2012-04-19 LAB — COMPREHENSIVE METABOLIC PANEL
ALT: 11 U/L (ref 0–35)
AST: 19 U/L (ref 0–37)
Albumin: 4 g/dL (ref 3.5–5.2)
BUN: 21 mg/dL (ref 6–23)
Calcium: 9 mg/dL (ref 8.4–10.5)
Chloride: 100 mEq/L (ref 96–112)
Potassium: 5.4 mEq/L — ABNORMAL HIGH (ref 3.5–5.1)
Total Protein: 8.1 g/dL (ref 6.0–8.3)

## 2012-04-19 LAB — HEPATIC FUNCTION PANEL
Albumin: 4 g/dL (ref 3.5–5.2)
Bilirubin, Direct: 0.1 mg/dL (ref 0.0–0.3)
Total Protein: 8.1 g/dL (ref 6.0–8.3)

## 2012-04-19 LAB — HEMOGLOBIN A1C: Hgb A1c MFr Bld: 8.2 % — ABNORMAL HIGH (ref 4.6–6.5)

## 2012-04-19 NOTE — Progress Notes (Signed)
  Subjective:    Patient ID: Jacqueline Lamb, female    DOB: 23-Feb-1946, 66 y.o.   MRN: [id number]  HPI Jacqueline Lamb has not been seen for almost 19 months - due to lack of insurance. She presents today for follow and for evaluation of sore throat and proressive joint pain and disruption. She denies having any accelerated chest pain or shortness of breath. She does feel weak and puny but is w/o specific focal complaints. She has been able to continue most of her medications. She has not had any intercurrent hospitalizations.   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 No tobacco No EtOH  Review of Systems  Constitutional: Negative.   HENT: Negative.   Eyes: Negative.   Respiratory: Positive for shortness of breath. Negative for chest tightness, wheezing and stridor.   Cardiovascular: Negative for chest pain and palpitations.  Gastrointestinal: Positive for nausea, abdominal pain and constipation.  Genitourinary: Negative.   Musculoskeletal: Positive for back pain and arthralgias. Negative for joint swelling.  Skin: Positive for pallor.  Neurological: Negative.   Hematological:  Negative.   Psychiatric/Behavioral: Positive for dysphoric mood.       Objective:   Physical Exam Filed Vitals:   04/19/12 1419  BP: 110/60  Pulse: 86  Temp: 99.8 F (37.7 C)  Resp: 16   Wt Readings from Last 3 Encounters:  04/19/12 164 lb (74.39 kg)  07/17/10 188 lb (85.276 kg)  02/18/10 190 lb (86.183 kg)   Gen'l - pale but well nourished white woman in no acute distress HEENT - C&S clear, PERRLA Neck- supple Cor 2+ radial pulse, RRR Pulm - normal respiratons w/o increased WOB Neuro - A&O x 3, normal gait, no tremor.   Lab Results  Component Value Date   WBC 9.4 04/19/2012   HGB 10.7* 04/19/2012   HCT 32.1* 04/19/2012   PLT 318.0 04/19/2012   GLUCOSE 194* 04/19/2012   CHOL 138 04/19/2012   TRIG 232.0* 04/19/2012   HDL 44.70 04/19/2012   LDLDIRECT 57.7 04/19/2012   LDLCALC 66 11/13/2009        ALT 11 04/19/2012   AST 19 04/19/2012        NA 138 04/19/2012   K 5.4* 04/19/2012   CL 100 04/19/2012   CREATININE 1.2 04/19/2012   BUN 21 04/19/2012   CO2 25 04/19/2012   TSH 0.62 11/13/2009   INR 0.9 RATIO 11/09/2007   HGBA1C 8.2* 04/19/2012          Assessment & Plan:

## 2012-04-20 LAB — LDL CHOLESTEROL, DIRECT: Direct LDL: 57.7 mg/dL

## 2012-04-23 MED ORDER — GLIMEPIRIDE 4 MG PO TABS: 4.0000 mg | ORAL_TABLET | Freq: Every day | ORAL | Status: DC

## 2012-04-23 NOTE — Assessment & Plan Note (Signed)
Excellent control with LDL better than goal of 80 or less. Liver functions normal. No evidence of myositis or other adverse affects of medication.  Plan  Continue zocor 40 mg

## 2012-04-23 NOTE — Assessment & Plan Note (Signed)
Stable without c/o chest pain. Overdue for cardiology visit.  Plan - follow up with cardiology at her convenience  Continued risk factor modification

## 2012-04-23 NOTE — Assessment & Plan Note (Signed)
Chronic anemia going back to April '10. Known to be iron deficient April '10  Plan Iron supplement  Needs colonoscopy

## 2012-04-23 NOTE — Assessment & Plan Note (Signed)
A1C of 8.2% - poor control. Medical therapy is metformin 1000 mg twice a day.  Plan - need to add cost effective second product: glimeperide 4 mg once a day. (Rx to drugstore)

## 2012-04-25 ENCOUNTER — Encounter: Payer: Self-pay | Admitting: Internal Medicine

## 2012-05-04 ENCOUNTER — Telehealth: Payer: Self-pay | Admitting: *Deleted

## 2012-05-04 ENCOUNTER — Other Ambulatory Visit: Payer: Self-pay | Admitting: Internal Medicine

## 2012-05-04 MED ORDER — OXYCODONE-ACETAMINOPHEN 5-325 MG PO TABS: 1.0000 | ORAL_TABLET | Freq: Four times a day (QID) | ORAL | Status: DC | PRN

## 2012-05-04 NOTE — Telephone Encounter (Signed)
Message copied by Elnora Morrison on Wed May 04, 2012 10:10 AM ------      Message from: Jacques Navy      Created: Sat Apr 23, 2012  1:16 PM       Fannie Knee, call patient: new Rx for diabetes sent to pharmacy = amaryl 4 mg once a day.

## 2012-05-30 ENCOUNTER — Other Ambulatory Visit: Payer: Self-pay | Admitting: Internal Medicine

## 2012-06-13 ENCOUNTER — Other Ambulatory Visit: Payer: Self-pay | Admitting: Internal Medicine

## 2012-06-28 ENCOUNTER — Other Ambulatory Visit: Payer: Self-pay | Admitting: Internal Medicine

## 2012-08-10 ENCOUNTER — Other Ambulatory Visit: Payer: Self-pay | Admitting: Internal Medicine

## 2012-08-10 MED ORDER — OXYCODONE-ACETAMINOPHEN 5-325 MG PO TABS: 1.0000 | ORAL_TABLET | Freq: Four times a day (QID) | ORAL | Status: DC | PRN

## 2012-08-10 NOTE — Telephone Encounter (Signed)
REQUESTING OXYCODONE RX TO PICK UP.  OK TO LEAVE A MESSAGE WHEN READY TO PICK UP.  SHE IS OUT.

## 2012-08-10 NOTE — Telephone Encounter (Signed)
Rx printed and to be signed per Dr, Debby Bud for patient to pick up . Patient; notified of this.

## 2012-08-10 NOTE — Telephone Encounter (Signed)
Ok for refill - 30days, 3 Rx's

## 2012-08-12 ENCOUNTER — Telehealth: Payer: Self-pay | Admitting: *Deleted

## 2012-08-12 NOTE — Telephone Encounter (Signed)
PATIENT HAD REQUEST INFORMATION OF HER AND HER HUSBAND BRUCE HAVING RECEIVED THE VACCINE TDaP SO THEY COULD BE WITH THEIR GRANDCHILD. EXPLAINED HAD RESEARCHED HER AND HER HUSBAND CHART AND SHE HAS RECEIVED NO VACCINE OF TDaP. HER HUSBAND HAD TD VACCINE ON 07/26/2012 DUE TO MEDICARE NOT COVER THE TDaP. PATIENT TO CALL AND SCHEDULE A NURSE VISIT./

## 2012-09-07 ENCOUNTER — Other Ambulatory Visit: Payer: Self-pay | Admitting: Internal Medicine

## 2012-09-13 ENCOUNTER — Other Ambulatory Visit: Payer: Self-pay | Admitting: Internal Medicine

## 2012-10-07 ENCOUNTER — Other Ambulatory Visit: Payer: Self-pay | Admitting: Internal Medicine

## 2012-12-27 ENCOUNTER — Other Ambulatory Visit: Payer: Self-pay | Admitting: Internal Medicine

## 2013-02-03 ENCOUNTER — Other Ambulatory Visit: Payer: Self-pay | Admitting: Internal Medicine

## 2013-02-14 ENCOUNTER — Other Ambulatory Visit: Payer: Self-pay

## 2013-02-14 MED ORDER — OXYCODONE-ACETAMINOPHEN 5-325 MG PO TABS: 1.0000 | ORAL_TABLET | Freq: Four times a day (QID) | ORAL | Status: DC | PRN

## 2013-02-14 NOTE — Telephone Encounter (Signed)
Pt calls requesting a refill on Oxycodone she takes for her neck pain. She has an appt to see Dr Debby Bud 04/05/13.

## 2013-02-14 NOTE — Telephone Encounter (Signed)
Called the patient left detailed message that hardcopy is ready for pickup at the front desk. 

## 2013-02-14 NOTE — Telephone Encounter (Signed)
Done hardcopy to robin  

## 2013-03-06 ENCOUNTER — Other Ambulatory Visit: Payer: Self-pay | Admitting: Internal Medicine

## 2013-03-17 ENCOUNTER — Other Ambulatory Visit: Payer: Self-pay | Admitting: Internal Medicine

## 2013-04-05 ENCOUNTER — Ambulatory Visit (INDEPENDENT_AMBULATORY_CARE_PROVIDER_SITE_OTHER): Payer: No Typology Code available for payment source | Admitting: Internal Medicine

## 2013-04-05 ENCOUNTER — Encounter: Payer: Self-pay | Admitting: Internal Medicine

## 2013-04-05 ENCOUNTER — Other Ambulatory Visit (INDEPENDENT_AMBULATORY_CARE_PROVIDER_SITE_OTHER): Payer: No Typology Code available for payment source

## 2013-04-05 VITALS — BP 120/62 | HR 89 | Temp 97.6°F | Resp 12 | Ht 66.75 in | Wt 171.4 lb

## 2013-04-05 DIAGNOSIS — D649 Anemia, unspecified: Secondary | ICD-10-CM

## 2013-04-05 DIAGNOSIS — Z853 Personal history of malignant neoplasm of breast: Secondary | ICD-10-CM

## 2013-04-05 DIAGNOSIS — R0602 Shortness of breath: Secondary | ICD-10-CM

## 2013-04-05 DIAGNOSIS — Z23 Encounter for immunization: Secondary | ICD-10-CM

## 2013-04-05 DIAGNOSIS — M2550 Pain in unspecified joint: Secondary | ICD-10-CM

## 2013-04-05 DIAGNOSIS — I2510 Atherosclerotic heart disease of native coronary artery without angina pectoris: Secondary | ICD-10-CM

## 2013-04-05 DIAGNOSIS — G5790 Unspecified mononeuropathy of unspecified lower limb: Secondary | ICD-10-CM

## 2013-04-05 DIAGNOSIS — E785 Hyperlipidemia, unspecified: Secondary | ICD-10-CM

## 2013-04-05 DIAGNOSIS — E119 Type 2 diabetes mellitus without complications: Secondary | ICD-10-CM

## 2013-04-05 DIAGNOSIS — Z0000 Encounter for general adult medical examination without abnormal findings: Secondary | ICD-10-CM

## 2013-04-05 DIAGNOSIS — M5030 Other cervical disc degeneration, unspecified cervical region: Secondary | ICD-10-CM

## 2013-04-05 LAB — COMPREHENSIVE METABOLIC PANEL
ALT: 16 U/L (ref 0–35)
AST: 18 U/L (ref 0–37)
Albumin: 4.1 g/dL (ref 3.5–5.2)
BUN: 29 mg/dL — ABNORMAL HIGH (ref 6–23)
CO2: 25 mEq/L (ref 19–32)
Calcium: 9.8 mg/dL (ref 8.4–10.5)
Chloride: 99 mEq/L (ref 96–112)
Creatinine, Ser: 1.1 mg/dL (ref 0.4–1.2)
GFR: 52.42 mL/min — ABNORMAL LOW (ref >60.00)
Potassium: 5.9 mEq/L — ABNORMAL HIGH (ref 3.5–5.1)

## 2013-04-05 LAB — BRAIN NATRIURETIC PEPTIDE: Pro B Natriuretic peptide (BNP): 987 pg/mL — ABNORMAL HIGH (ref 0.0–100.0)

## 2013-04-05 LAB — CBC WITH DIFFERENTIAL/PLATELET
Basophils Relative: 0.7 % (ref 0.0–3.0)
Eosinophils Relative: 0.6 % (ref 0.0–5.0)
HCT: 33.7 % — ABNORMAL LOW (ref 36.0–46.0)
Hemoglobin: 11.1 g/dL — ABNORMAL LOW (ref 12.0–15.0)
Lymphs Abs: 1.8 10*3/uL (ref 0.7–4.0)
MCV: 93.8 fl (ref 78.0–100.0)
Monocytes Absolute: 0.9 10*3/uL (ref 0.1–1.0)
Monocytes Relative: 8.4 % (ref 3.0–12.0)
Neutro Abs: 7.7 10*3/uL (ref 1.4–7.7)
WBC: 10.5 10*3/uL (ref 4.5–10.5)

## 2013-04-05 LAB — HEPATIC FUNCTION PANEL
ALT: 16 U/L (ref 0–35)
Bilirubin, Direct: 0.2 mg/dL (ref 0.0–0.3)
Total Bilirubin: 0.8 mg/dL (ref 0.3–1.2)

## 2013-04-05 LAB — LIPID PANEL
HDL: 37.9 mg/dL — ABNORMAL LOW (ref >39.00)
Total CHOL/HDL Ratio: 3

## 2013-04-05 LAB — CK: Total CK: 31 U/L (ref 7–177)

## 2013-04-05 MED ORDER — OXYCODONE-ACETAMINOPHEN 5-325 MG PO TABS: 1.0000 | ORAL_TABLET | Freq: Four times a day (QID) | ORAL | Status: DC | PRN

## 2013-04-05 MED ORDER — GLIMEPIRIDE 4 MG PO TABS: 4.0000 mg | ORAL_TABLET | Freq: Every day | ORAL | Status: DC

## 2013-04-05 MED ORDER — DIAZEPAM 2 MG PO TABS: 2.0000 mg | ORAL_TABLET | Freq: Four times a day (QID) | ORAL | Status: DC | PRN

## 2013-04-05 MED ORDER — METOPROLOL TARTRATE 25 MG PO TABS: ORAL_TABLET | ORAL | Status: DC

## 2013-04-05 MED ORDER — METFORMIN HCL 1000 MG PO TABS: 1000.0000 mg | ORAL_TABLET | Freq: Two times a day (BID) | ORAL | Status: DC

## 2013-04-05 MED ORDER — FUROSEMIDE 40 MG PO TABS: ORAL_TABLET | ORAL | Status: DC

## 2013-04-05 MED ORDER — LISINOPRIL 20 MG PO TABS: 20.0000 mg | ORAL_TABLET | Freq: Every day | ORAL | Status: DC

## 2013-04-05 MED ORDER — SIMVASTATIN 40 MG PO TABS: 40.0000 mg | ORAL_TABLET | Freq: Every day | ORAL | Status: DC

## 2013-04-05 NOTE — Patient Instructions (Addendum)
Thanks for coming in.  We will investigate the breathing issues by starting with a cardiac evaluation.  For joint pain - will check labs for rheumatoid arthritis, gout, connective tissue disease - all by lab studies.  Health maintenance - will give pneumonia vaccine and tetanus today.  For neck pain with muscle spasms - take valium 2 mg every 6 hours as needed. For pain continue with the oxycodone  You will need to come back a week or so after your echo.

## 2013-04-05 NOTE — Progress Notes (Signed)
Subjective:    Patient ID: Jacqueline Lamb, female    DOB: 1945/10/11, 67 y.o.   MRN: [id number]  HPI The patient is here for annual Medicare wellness examination and management of other chronic and acute problems.  Interval history - having a lot of neck and arthritic pain. She did have a flare swelling left wrist that did respond to NSAIDs. For the neck pain she does take oxycodone with relief but it makes her groggy. Her nights are interrupted by the need for hot showers for relief of body pain. She also has muscle spasms.   She reports her stomach pain is better and she is eating better.  She has no stamina and she also has had increased SOB/DOE. She require  Assistance with ADLs.    The risk factors are reflected in the social history.  The roster of all physicians providing medical care to patient - is listed in the Snapshot section of the chart.  Activities of daily living:  The patient is 100% inedpendent in all ADLs: dressing, toileting, feeding as well as independent mobility  Home safety : The patient has smoke detectors in the home. They wear seatbelts.No firearms at home ( firearms are present in the home, kept in a safe fashion). There is no violence in the home.   There is no risks for hepatitis, STDs or HIV. There is no   history of blood transfusion. They have no travel history to infectious disease endemic areas of the world.  The patient has (has not) seen their dentist in the last six month. They have (not) seen their eye doctor in the last year. They deny (admit to) any hearing difficulty and have not had audiologic testing in the last year.  They do not  have excessive sun exposure. Discussed the need for sun protection: hats, long sleeves and use of sunscreen if there is significant sun exposure.   Diet: the importance of a healthy diet is discussed. They do have a healthy (unhealthy-high fat/fast food) diet.  The patient has a regular exercise program: _______ ,  ____duration, _____per week.  The benefits of regular aerobic exercise were discussed.  Depression screen: there are no signs or vegative symptoms of depression- irritability, change in appetite, anhedonia, sadness/tearfullness.  Cognitive assessment: the patient manages all their financial and personal affairs and is actively engaged. They could relate day,date,year and events; recalled 3/3 objects at 3 minutes; performed clock-face test normally.  The following portions of the patient's history were reviewed and updated as appropriate: allergies, current medications, past family history, past medical history,  past surgical history, past social history  and problem list.  Vision, hearing, body mass index were assessed and reviewed.   During the course of the visit the patient was educated and counseled about appropriate screening and preventive services including : fall prevention , diabetes screening, nutrition counseling, colorectal cancer screening, and recommended immunizations.  Past Medical History  Diagnosis Date  . Cancer 1982    s/p mastectomy and XRT  . Diabetes mellitus     type 2  . Hyperlipidemia   . Mitral insufficiency 2003    s/p annuloplasty   . Coronary artery disease 2003    s/p  CABG; S/P PTCA /STENT 11/2007  . DDD (degenerative disc disease), cervical   . 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 Cervical cancer   . Esophageal stricture   . Peripheral neuropathy    Past Surgical History  Procedure Laterality Date  .  Appendectomy    . Coronary artery bypass graft    . Mv annulosplasty    . Cholecystectomy    . Abdominal hysterectomy    . Breast surgery  1980    LEFT BREAST  . Esophagogastroduodenoscopy  09/22/2000  . Spine surgery  2008    C-SPINE DISKECTOMY AND FUSION C6-7   Family History  Problem Relation Age of Onset  . Heart disease Mother   . Mental illness Mother     DEMNTIA   History   Social History  . Marital Status: Married    Spouse Name:  N/A    Number of Children: N/A  . Years of Education: N/A   Occupational History  . Not on file.   Social History Main Topics  . Smoking status: Never Smoker   . Smokeless tobacco: Not on file  . Alcohol Use: No  . Drug Use: No  . Sexually Active: Not on file   Other Topics Concern  . 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 oxybutynin (DITROPAN) 5 MG tablet Take 1 tablet (5 mg total) by mouth every evening.  90 tablet  1   No current facility-administered medications on file prior to visit.     Review of Systems Constitutional:  Negative for fever, chills, activity change and unexpected weight change.  HEENT:  Negative for hearing loss, ear pain, congestion. Positive for neck stiffness. Negative for postnasal drip. Negative for sore throat or swallowing problems. Negative for dental complaints.   Eyes: Negative for vision loss or change in visual acuity.  Respiratory: Positive for chest tightness and for DOE.   Cardiovascular: Negative for frank chest pain or palpitations. Positive for decreased exercise tolerance Gastrointestinal: No change in bowel habit. No bloating or gas. No reflux or indigestion Genitourinary: Negative for urgency, frequency, flank pain and difficulty urinating.  Musculoskeletal: Positive for myalgias, back pain, arthralgias.  Neurological: Negative for dizziness, tremors. Positive for weakness and headaches.  Hematological: Negative for adenopathy.  Psychiatric/Behavioral: Negative for behavioral problems and dysphoric mood.       Objective:   Physical Exam Filed Vitals:   04/05/13 1441  BP: 120/62  Pulse: 89  Temp: 97.6 F (36.4 C)  Resp: 12   Wt Readings from Last 3 Encounters:  04/05/13 171 lb 6.4 oz (77.747 kg)  04/19/12 164 lb (74.39 kg)  07/17/10 188 lb (85.276 kg)   Gen'l:  well nourished, well developed white Woman in no distress but uncomfortable HEENT - [place]/AT, EACs/TMs normal, oropharynx with native dentition in good condition, no buccal or palatal lesions, posterior pharynx clear, mucous membranes moist. C&S clear, PERRLA, fundi - normal Neck - decreased ROM, no thyromegaly Nodes- negative submental, cervical, supraclavicular regions Chest - no deformity, no CVAT Lungs - clear without rales, wheezes. No increased work of breathing Breast - deferred Cardiovascular - regular rate and rhythm, quiet precordium, no murmurs, rubs or gallops, 2+ radial, DP and PT pulses Abdomen - BS+ x 4, no HSM, no guarding or rebound or tenderness Pelvic - deferred  Rectal - deferred Extremities - no clubbing, cyanosis, edema or deformity.  Neuro - A&O x 3, CN II-XII normal, motor strength normal and equal, DTRs 2+ and symmetrical biceps, radial, and patellar tendons. Cerebellar - no tremor, no rigidity, fluid  movement and normal gait. Derm - Head, neck, back, abdomen and extremities without suspicious lesions  Recent Results (from the past 2160 hour(s))  RHEUMATOID FACTOR     Status: None   Collection Time    04/05/13  4:31 PM      Result Value Range   Rheumatoid Factor <10  <=14 IU/mL   Comment:                                Interpretive Table                         Low Positive: 15 - 41 IU/mL                         High Positive:  >= 42 IU/mL            In addition to the RF result, and clinical symptoms including joint      involvement, the 2010 ACR Classification Criteria for      scoring/diagnosing Rheumatoid Arthritis include the results of the      following tests:  CRP (29562), ESR (15010), and CCP (APCA) (13086).      www.rheumatology.org/practice/clinical/classification/ra/ra_2010.asp  ANA     Status: None   Collection Time    04/05/13  4:31 PM      Result Value Range   ANA NEG  NEGATIVE  HEMOGLOBIN A1C     Status: Abnormal   Collection Time     04/05/13  4:31 PM      Result Value Range   Hemoglobin A1C 9.8 (*) 4.6 - 6.5 %   Comment: Glycemic Control Guidelines for People with Diabetes:Non Diabetic:  <6%Goal of Therapy: <7%Additional Action Suggested:  >8%   HEPATIC FUNCTION PANEL     Status: None   Collection Time    04/05/13  4:31 PM      Result Value Range   Total Bilirubin 0.8  0.3 - 1.2 mg/dL   Bilirubin, Direct 0.2  0.0 - 0.3 mg/dL   Alkaline Phosphatase 91  39 - 117 U/L   AST 18  0 - 37 U/L   ALT 16  0 - 35 U/L   Total Protein 7.8  6.0 - 8.3 g/dL   Albumin 4.1  3.5 - 5.2 g/dL  TSH     Status: None   Collection Time    04/05/13  4:31 PM      Result Value Range   TSH 0.52  0.35 - 5.50 uIU/mL  COMPREHENSIVE METABOLIC PANEL     Status: Abnormal   Collection Time    04/05/13  4:31 PM      Result Value Range   Sodium 135  135 - 145 mEq/L   Potassium 5.9 (*) 3.5 - 5.1 mEq/L   Chloride 99  96 - 112 mEq/L   CO2 25  19 - 32 mEq/L   Glucose, Bld 244 (*) 70 - 99 mg/dL   BUN 29 (*) 6 - 23 mg/dL   Creatinine, Ser 1.1  0.4 - 1.2 mg/dL   Total Bilirubin 0.8  0.3 - 1.2 mg/dL   Alkaline Phosphatase 91  39 - 117 U/L   AST 18  0 - 37 U/L   ALT 16  0 - 35 U/L   Total Protein 7.8  6.0 - 8.3 g/dL   Albumin 4.1  3.5 - 5.2 g/dL  Calcium 9.8  8.4 - 10.5 mg/dL   GFR 14.78 (*) >29.56 mL/min  LIPID PANEL     Status: Abnormal   Collection Time    04/05/13  4:31 PM      Result Value Range   Cholesterol 117  0 - 200 mg/dL   Comment: ATP III Classification       Desirable:  < 200 mg/dL               Borderline High:  200 - 239 mg/dL          High:  > = 213 mg/dL   Triglycerides 086.5 (*) 0.0 - 149.0 mg/dL   Comment: Normal:  <784 mg/dLBorderline High:  150 - 199 mg/dL   HDL 69.62 (*) >95.28 mg/dL   VLDL 41.3  0.0 - 24.4 mg/dL   LDL Cholesterol 48  0 - 99 mg/dL   Total CHOL/HDL Ratio 3     Comment:                Men          Women1/2 Average Risk     3.4          3.3Average Risk          5.0          4.42X Average Risk          9.6           7.13X Average Risk          15.0          11.0                      CBC WITH DIFFERENTIAL     Status: Abnormal   Collection Time    04/05/13  4:31 PM      Result Value Range   WBC 10.5  4.5 - 10.5 K/uL   RBC 3.59 (*) 3.87 - 5.11 Mil/uL   Hemoglobin 11.1 (*) 12.0 - 15.0 g/dL   HCT 01.0 (*) 27.2 - 53.6 %   MCV 93.8  78.0 - 100.0 fl   MCHC 32.9  30.0 - 36.0 g/dL   RDW 64.4 (*) 03.4 - 74.2 %   Platelets 284.0  150.0 - 400.0 K/uL   Neutrophils Relative % 73.1  43.0 - 77.0 %   Lymphocytes Relative 17.2  12.0 - 46.0 %   Monocytes Relative 8.4  3.0 - 12.0 %   Eosinophils Relative 0.6  0.0 - 5.0 %   Basophils Relative 0.7  0.0 - 3.0 %   Neutro Abs 7.7  1.4 - 7.7 K/uL   Lymphs Abs 1.8  0.7 - 4.0 K/uL   Monocytes Absolute 0.9  0.1 - 1.0 K/uL   Eosinophils Absolute 0.1  0.0 - 0.7 K/uL   Basophils Absolute 0.1  0.0 - 0.1 K/uL  SEDIMENTATION RATE     Status: Abnormal   Collection Time    04/05/13  4:31 PM      Result Value Range   Sed Rate 37 (*) 0 - 22 mm/hr  URIC ACID     Status: Abnormal   Collection Time    04/05/13  4:31 PM      Result Value Range   Uric Acid, Serum 11.0 (*) 2.4 - 7.0 mg/dL  CK     Status: None   Collection Time    04/05/13  4:31 PM      Result Value Range  Total CK 31  7 - 177 U/L  BRAIN NATRIURETIC PEPTIDE     Status: Abnormal   Collection Time    04/05/13  4:31 PM      Result Value Range   Pro B Natriuretic peptide (BNP) 987.0 (*) 0.0 - 100.0 pg/mL         Assessment & Plan:

## 2013-04-06 LAB — ANA: Anti Nuclear Antibody(ANA): NEGATIVE

## 2013-04-08 DIAGNOSIS — M2550 Pain in unspecified joint: Secondary | ICD-10-CM | POA: Insufficient documentation

## 2013-04-08 DIAGNOSIS — Z0000 Encounter for general adult medical examination without abnormal findings: Secondary | ICD-10-CM | POA: Insufficient documentation

## 2013-04-08 NOTE — Assessment & Plan Note (Signed)
Interval history - no hospitalizations or surgery but she has had progressive disability with dyspnea, peripheral neuropathy, diffuse arthralgia and myalgia that has greatly limited her activity. Physical exam, sans breast and pelvic, in unremarkable. General lab results are OK. She is due for colonoscopy -declines at this time; overdue for mammogram - currently being scheduled. Immunizations are brought up to date except for shingles vaccine - she is to check on insurance coverage.  In summary  A very nice woman with a significant disease burden and discomfort. Evaluations on different fronts in progress. She will return after echo is done and will address her diabetes and generalized discomfort and weakness.

## 2013-04-08 NOTE — Assessment & Plan Note (Signed)
Complaint of diffuse arthralgia and myalgia. No specific physical findings or joint deformity.   Plan Full lab eval to look for connective tissue disease, arthritis, rheumatoid arthritis or other systemic disease as possible cause.  Addendum: Lab July '14: FR neg, ANA neg, CCP pending, ESR 37, total CK normal, Uric Acid 11  Plan With elevated Uric acid may need to consider allopurinol  For persistent discomfort will consider Rheumatology consult.

## 2013-04-08 NOTE — Assessment & Plan Note (Signed)
She does c/o pain and weakness in the LE. Currently on no medication for neuropathy.  Plan Attain better control of diabetes  Consider amitriptyline  Vs gabapentin

## 2013-04-08 NOTE — Assessment & Plan Note (Signed)
No c/o chest pain but generalized weakness, decreased exercise tolerance. She has not had recent follow up with known MV repair in '03 and in-graft stenosis with stent in '09  Plan Continue risk reduction - getting DM under control, keeping lipid under control, control BP  F/u 2D echo  Cardiology follow up after Echo

## 2013-04-08 NOTE — Assessment & Plan Note (Signed)
Very stable anemia   Plan Continue iron supplement

## 2013-04-08 NOTE — Assessment & Plan Note (Signed)
Takes simvastatin without adverse effects.  PLan Follow up lab  Addendum:  LDL @ 48 is better than goal of 80 or less in patient with CAD. HDL is low at 37 - may consider fish oil 3 g daily.

## 2013-04-08 NOTE — Assessment & Plan Note (Addendum)
Jacqueline Lamb reports increased dyspnea.  Plan Start with cardiac evaluation - 2D echo.  If not a cardiac issue will obtain PFT's  Addendum: pBNP 987  Plan Will increase lasix.

## 2013-04-08 NOTE — Assessment & Plan Note (Signed)
Does not track CBGs Lab Results  Component Value Date   HGBA1C 9.8* 04/05/2013   Out of control  Plan Follow up OV to discuss change in management.

## 2013-04-08 NOTE — Assessment & Plan Note (Signed)
Jacqueline Lamb with a history of DDD s/p decompression with allograft and plate fixation. She is having neck pain but does not c/o radicular pain and her exam does not reveal a radiculopathy.  Plan Home/self physical therapy with ROM  OK for NSAIDs for pain  For any worsening of symptoms consult with NS (Dr. Danielle Dess prior to any imaging)

## 2013-04-11 ENCOUNTER — Ambulatory Visit (HOSPITAL_COMMUNITY): Payer: Medicare Other | Attending: Cardiology | Admitting: Radiology

## 2013-04-11 DIAGNOSIS — I059 Rheumatic mitral valve disease, unspecified: Secondary | ICD-10-CM | POA: Insufficient documentation

## 2013-04-11 DIAGNOSIS — C50919 Malignant neoplasm of unspecified site of unspecified female breast: Secondary | ICD-10-CM | POA: Insufficient documentation

## 2013-04-11 DIAGNOSIS — I079 Rheumatic tricuspid valve disease, unspecified: Secondary | ICD-10-CM | POA: Insufficient documentation

## 2013-04-11 DIAGNOSIS — E119 Type 2 diabetes mellitus without complications: Secondary | ICD-10-CM | POA: Insufficient documentation

## 2013-04-11 DIAGNOSIS — E785 Hyperlipidemia, unspecified: Secondary | ICD-10-CM | POA: Insufficient documentation

## 2013-04-11 DIAGNOSIS — I359 Nonrheumatic aortic valve disorder, unspecified: Secondary | ICD-10-CM | POA: Insufficient documentation

## 2013-04-11 DIAGNOSIS — R0602 Shortness of breath: Secondary | ICD-10-CM | POA: Insufficient documentation

## 2013-04-11 DIAGNOSIS — I2510 Atherosclerotic heart disease of native coronary artery without angina pectoris: Secondary | ICD-10-CM | POA: Insufficient documentation

## 2013-04-11 NOTE — Progress Notes (Signed)
Echocardiogram performed.  

## 2013-04-14 ENCOUNTER — Encounter: Payer: Self-pay | Admitting: Internal Medicine

## 2013-04-14 ENCOUNTER — Ambulatory Visit (INDEPENDENT_AMBULATORY_CARE_PROVIDER_SITE_OTHER): Payer: No Typology Code available for payment source | Admitting: Internal Medicine

## 2013-04-14 VITALS — BP 110/69 | HR 82 | Wt 170.0 lb

## 2013-04-14 DIAGNOSIS — I2510 Atherosclerotic heart disease of native coronary artery without angina pectoris: Secondary | ICD-10-CM

## 2013-04-14 LAB — BASIC METABOLIC PANEL
BUN: 31 mg/dL — ABNORMAL HIGH (ref 6–23)
Calcium: 9.7 mg/dL (ref 8.4–10.5)
Creatinine, Ser: 1.3 mg/dL — ABNORMAL HIGH (ref 0.4–1.2)
GFR: 44.47 mL/min — ABNORMAL LOW (ref >60.00)
Potassium: 6.1 mEq/L (ref 3.5–5.1)

## 2013-04-14 NOTE — Patient Instructions (Addendum)
You will need to have lab work today: bmp We will call you with your results  Your physician recommends that you continue on your current medications as directed. Please refer to the Current Medication list given to you today.

## 2013-04-14 NOTE — Progress Notes (Addendum)
HPIMs. Jacqueline Lamb is Jacqueline 67 year old woman who I have  followed in clinic in past.  I have not seen her since May 2012.  She stopped coming because of lack of insurance.  She has Jacqueline history of CAD and severe MR  She is s/p status post CABG x 1 (RIMA to LAD) and mitral valve repair in 2003.  Of note at time of surgery pericardium was noted to have adhesions, felt seconday to XRT from breast CA in past. In 2009 she underwent cardiac catheterization for dyspnea  This showed: Right heart catheterization Jacqueline mean RA pressure of 7,  pulmonary capillary wedge pressure mean was 16 with Jacqueline prominent V-wave.  There was no evidence for restrictive pericarditis. There was no  equalization of pressures and there was normal respiratory variation.  No dip and plateau seen.  Left heart catheterization showed the left main with Jacqueline 25% ostial  lesion, the mid LAD was occluded, distal vessel had Jacqueline 30% stenosis  filling via the RIMA graft. It was Jacqueline 1.5-mm vessel. Circumflex had Jacqueline  25% stenosis. The ramus was very large, had Jacqueline 25% proximal stenosis and  the RCA had no significant disease. The RIMA to the LAD, though, there  was Jacqueline 99% anastomotic lesion.  She had some improvement in her symptoms with this.  The patient was seen recently by Jacqueline Lamb her primary doctor.  She complained of progressive weakness, DOE.  Echo was scheduled.  This was done on 04/11/13.  I have reviewed this echo  LVEF was reported as 30 to 35%  I think it may be better than this, around 40 to 45%  The inferior and septal walls are hypokinetic.  In comparison to echo from 2008 inferior changes are new. Aortic insuffuciency is rel unchanged (mild to moderate)  MR appears more pronounced.  She says she feels like she just can't do much at all  Rests around house all the time.  She does have other medical issues that limit her activity (arthritis, neuropathy) but she says that she gets very SOB She denies PND.  No Chest pains.  She does not want to be  seen or have testing done at [facility]  Had problems with billing in past.  WOuld like to go elsewhere. No Known Allergies  Current Outpatient Prescriptions  Medication Sig Dispense Refill  . diazepam (VALIUM) 2 MG tablet Take 1 tablet (2 mg total) by mouth every 6 (six) hours as needed (every 6 hours as needed for muscle spasm.).  120 tablet  3  . furosemide (LASIX) 40 MG tablet TAKE ONE TABLET BY MOUTH EVERY DAY CAN TAKE ONE AND ONE-HALF TABLET AS NEEDED  135 tablet  3  . glimepiride (AMARYL) 4 MG tablet Take 1 tablet (4 mg total) by mouth daily before breakfast.  90 tablet  3  . lisinopril (PRINIVIL,ZESTRIL) 20 MG tablet Take 1 tablet (20 mg total) by mouth daily.  90 tablet  3  . metFORMIN (GLUCOPHAGE) 1000 MG tablet Take 1 tablet (1,000 mg total) by mouth 2 (two) times daily with Jacqueline meal.  180 tablet  3  . metoprolol tartrate (LOPRESSOR) 25 MG tablet TAKE ONE-HALF TABLET BY MOUTH TWICE DAILY  90 tablet  3  . oxybutynin (DITROPAN) 5 MG tablet Take 5 mg by mouth as needed.      Marland Kitchen oxyCODONE-acetaminophen (ROXICET) 5-325 MG per tablet Take 1 tablet by mouth every 6 (six) hours as needed for pain. FILL ON OR AFTER 06/25/13  120 tablet  0  . simvastatin (ZOCOR) 40 MG tablet Take 1 tablet (40 mg total) by mouth daily.  90 tablet  3   No current facility-administered medications for this visit.    Past Medical History  Diagnosis Date  . Cancer 1982    s/p mastectomy and XRT  . Diabetes mellitus     type 2  . Hyperlipidemia   . Mitral insufficiency 2003    s/p annuloplasty   . Coronary artery disease 2003    s/p  CABG; S/P PTCA /STENT 11/2007  . DDD (degenerative disc disease), cervical   . 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 Cervical cancer   . Esophageal stricture   . Peripheral neuropathy     Past Surgical History  Procedure Laterality Date  . Appendectomy    . Coronary artery bypass graft    . Mv annulosplasty    . Cholecystectomy    . Abdominal hysterectomy    . Breast  surgery  1980    LEFT BREAST  . Esophagogastroduodenoscopy  09/22/2000  . Spine surgery  2008    C-SPINE DISKECTOMY AND FUSION C6-7    Family History  Problem Relation Age of Onset  . Heart disease Mother   . Mental illness Mother     DEMNTIA    History   Social History  . Marital Status: Married    Spouse Name: N/Jacqueline    Number of Children: N/Jacqueline  . Years of Education: N/Jacqueline   Occupational History  . Not on file.   Social History Main Topics  . Smoking status: Never Smoker   . Smokeless tobacco: Not on file  . Alcohol Use: No  . Drug Use: No  . Sexually Active: Not on file   Other Topics Concern  . 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  They are negative to the above problem except as previously stated.  Vital Signs: BP 110/69  Pulse 82  Wt 170 lb (77.111 kg)  BMI 26.84 kg/m2  Physical Exam Patient is in NAD.  Moving slowly. HEENT:  Normocephalic, atraumatic. EOMI, PERRLA.  Neck: JVP is increased at 10 No bruits.  Lungs: clear to auscultation. No rales no wheezes.  Chest:  S/p L mastectomy Heart: Regular rate and rhythm. Normal S1, S2. No S3.  II/VI systolic murmur  Abdomen:  Supple, nontender. Normal bowel sounds. No masses. No hepatomegaly.  Extremities:   Good distal pulses throughout. Tr lower extremity edema.  Musculoskeletal :moving all extremities. R hand motion limited Neuro:   alert and oriented x3.  CN II-XII grossly intact.   Assessment and Plan:  1.  CHF.  I reviewed findings of echo with patient   INferior changes are new from prevous echo as is LV dysfunction.  I would recomm at the least Jacqueline R and L heart catheterization to review anatomy and pressures. SHe does have evidence of some increased fluid on exam.  She appears weak, short of breath. I would like to get labs today before changing meds Will contact Jacqueline Lamb at [facility] to discuss  further.    2.  CAD  As noted above  3.  Valvular dz.  Echo findings as noted.  WIll copy test and send to Jacqueline Rod MD at [facility]  4.  DM  Followed by Judie Petit Lamb  5.  HL  Keep on statin.

## 2013-04-15 ENCOUNTER — Other Ambulatory Visit: Payer: Self-pay | Admitting: Internal Medicine

## 2013-04-15 ENCOUNTER — Other Ambulatory Visit: Payer: Self-pay | Admitting: Adult Health

## 2013-04-15 DIAGNOSIS — E876 Hypokalemia: Secondary | ICD-10-CM

## 2013-04-15 LAB — BASIC METABOLIC PANEL
CO2: 28 mEq/L (ref 19–32)
Calcium: 9.3 mg/dL (ref 8.4–10.5)
Creat: 1.33 mg/dL — ABNORMAL HIGH (ref 0.50–1.10)
Sodium: 139 mEq/L (ref 135–145)

## 2013-04-16 ENCOUNTER — Encounter: Payer: Self-pay | Admitting: Internal Medicine

## 2013-04-18 ENCOUNTER — Other Ambulatory Visit: Payer: Self-pay | Admitting: *Deleted

## 2013-04-18 DIAGNOSIS — I509 Heart failure, unspecified: Secondary | ICD-10-CM

## 2013-04-19 ENCOUNTER — Other Ambulatory Visit (INDEPENDENT_AMBULATORY_CARE_PROVIDER_SITE_OTHER): Payer: No Typology Code available for payment source

## 2013-04-19 DIAGNOSIS — I2510 Atherosclerotic heart disease of native coronary artery without angina pectoris: Secondary | ICD-10-CM

## 2013-04-19 DIAGNOSIS — I509 Heart failure, unspecified: Secondary | ICD-10-CM

## 2013-04-19 LAB — BASIC METABOLIC PANEL
CO2: 25 mEq/L (ref 19–32)
Chloride: 96 mEq/L (ref 96–112)
Potassium: 4.7 mEq/L (ref 3.5–5.1)
Sodium: 134 mEq/L — ABNORMAL LOW (ref 135–145)

## 2013-04-19 LAB — BRAIN NATRIURETIC PEPTIDE: Pro B Natriuretic peptide (BNP): 1152 pg/mL — ABNORMAL HIGH (ref 0.0–100.0)

## 2013-04-21 ENCOUNTER — Telehealth: Payer: Self-pay | Admitting: Internal Medicine

## 2013-04-21 NOTE — Telephone Encounter (Signed)
Follow up  Pt just spoke with a nurse regarding her lab results but she said she forgot to ask a question.

## 2013-04-21 NOTE — Telephone Encounter (Signed)
Patient just wanted to let Dr Tenny Craw know that she only wants to go to [facility] if she needs any kind of procedure. If Dr Tenny Craw feels she can take care of her issues she would like to let her do so. Will forward to Dr Tenny Craw for review

## 2013-04-21 NOTE — Telephone Encounter (Signed)
F/u   Pt calling back to speak with nurse.

## 2013-04-23 NOTE — Telephone Encounter (Signed)
I understand.  I think she probably will need a procedure (cardiac cath) and would like Dr Regino Schultze to evaluate.

## 2013-04-24 ENCOUNTER — Encounter: Payer: Self-pay | Admitting: Internal Medicine

## 2013-04-25 NOTE — Telephone Encounter (Signed)
Left message to call back  

## 2013-04-26 NOTE — Telephone Encounter (Signed)
Advised patient

## 2013-05-08 ENCOUNTER — Telehealth: Payer: Self-pay | Admitting: Internal Medicine

## 2013-05-08 DIAGNOSIS — I2510 Atherosclerotic heart disease of native coronary artery without angina pectoris: Secondary | ICD-10-CM

## 2013-05-08 DIAGNOSIS — R0600 Dyspnea, unspecified: Secondary | ICD-10-CM

## 2013-05-08 NOTE — Telephone Encounter (Signed)
Patient would like to know how much lasix is safe for her to take. Pt   is taking 40 mg one and half tablets (60 mg)daily and it is not working. Pt states she is SOB. Pt states that  maybe 80 mg or more may work.

## 2013-05-08 NOTE — Telephone Encounter (Signed)
New Prob     Pt has a question regarding her LASIX and how much she can safely take. Please call.

## 2013-05-08 NOTE — Telephone Encounter (Signed)
Patient  has not been seen at [facility] yet.   Appt on 8/27 Notes progressive SOB  Rec:  2 lasix in AM   Labs tomorrow (BMET, BNP) See if appt tomorrow (8/5)  Please call me

## 2013-05-09 NOTE — Telephone Encounter (Signed)
Follow up ° ° °Pt returning your call °

## 2013-05-09 NOTE — Telephone Encounter (Signed)
I spoke with Jacqueline Lamb earlier this morning. She took 2 lasix this am. States she is feeling somewhat better when she gets up in the morning but not after eating.  She will come in tomorrow for a BMP & BNP Her appointment is not until 8/25 or 8/27 at [facility] according to Jacqueline Lamb.  Dr. Tenny Craw aware & would like to be called when Jacqueline Lamb comes in for lab work on 8/6 Mylo Red RN

## 2013-05-09 NOTE — Telephone Encounter (Signed)
LMTCB Debbie Arminda Foglio RN  

## 2013-05-10 ENCOUNTER — Ambulatory Visit (INDEPENDENT_AMBULATORY_CARE_PROVIDER_SITE_OTHER): Payer: Medicare Other | Admitting: Internal Medicine

## 2013-05-10 ENCOUNTER — Other Ambulatory Visit (INDEPENDENT_AMBULATORY_CARE_PROVIDER_SITE_OTHER): Payer: Medicare Other

## 2013-05-10 VITALS — BP 100/60 | HR 108 | Ht 67.0 in | Wt 167.0 lb

## 2013-05-10 DIAGNOSIS — I2510 Atherosclerotic heart disease of native coronary artery without angina pectoris: Secondary | ICD-10-CM

## 2013-05-10 DIAGNOSIS — R0989 Other specified symptoms and signs involving the circulatory and respiratory systems: Secondary | ICD-10-CM

## 2013-05-10 DIAGNOSIS — R0600 Dyspnea, unspecified: Secondary | ICD-10-CM

## 2013-05-10 LAB — BASIC METABOLIC PANEL
BUN: 31 mg/dL — ABNORMAL HIGH (ref 6–23)
Chloride: 96 mEq/L (ref 96–112)
Glucose, Bld: 182 mg/dL — ABNORMAL HIGH (ref 70–99)
Potassium: 3.9 mEq/L (ref 3.5–5.1)

## 2013-05-10 LAB — BRAIN NATRIURETIC PEPTIDE: Pro B Natriuretic peptide (BNP): 1262 pg/mL — ABNORMAL HIGH (ref 0.0–100.0)

## 2013-05-10 NOTE — Telephone Encounter (Signed)
Dr Tenny Craw saw pt today.

## 2013-05-11 ENCOUNTER — Telehealth: Payer: Self-pay | Admitting: *Deleted

## 2013-05-11 DIAGNOSIS — R0600 Dyspnea, unspecified: Secondary | ICD-10-CM

## 2013-05-11 NOTE — Telephone Encounter (Signed)
I spoke with pt husband this morning & pt will come in on Monday for lab work: BNP,BMP Husband says she is resting now. Understands drink OJ or V8 on days she takes 80mg  lasix Mylo Red RN

## 2013-05-15 ENCOUNTER — Other Ambulatory Visit (INDEPENDENT_AMBULATORY_CARE_PROVIDER_SITE_OTHER): Payer: Medicare Other

## 2013-05-15 DIAGNOSIS — R0600 Dyspnea, unspecified: Secondary | ICD-10-CM

## 2013-05-15 DIAGNOSIS — R0609 Other forms of dyspnea: Secondary | ICD-10-CM

## 2013-05-15 LAB — BASIC METABOLIC PANEL
CO2: 21 mEq/L (ref 19–32)
Chloride: 98 mEq/L (ref 96–112)
Creatinine, Ser: 1.4 mg/dL — ABNORMAL HIGH (ref 0.4–1.2)

## 2013-05-17 NOTE — Progress Notes (Addendum)
HPI Jacqueline Lamb has a history of CAD and severe MR She is s/p status post CABG x 1 (RIMA to LAD) and mitral valve repair in 2003. Of note at time of surgery pericardium was noted to have adhesions, felt seconday to XRT from breast CA in past.  In 2009 she underwent cardiac catheterization for dyspnea This showed:  Right heart catheterization a mean RA pressure of 7, pulmonary capillary wedge pressure mean was 16 with a prominent V-wave.  There was no evidence for restrictive pericarditis. There was no equalization of pressures and there was normal respiratory variation.  No dip and plateau seen.  Left heart catheterization showed the left main with a 25% ostial lesion, the mid LAD was occluded, distal vessel had a 30% stenosis  filling via the RIMA graft. It was a 1.5-mm vessel. Circumflex had a 25% stenosis. The ramus was very large, had a 25% proximal stenosis and  the RCA had no significant disease. The RIMA to the LAD, though, there was a 99% anastomotic lesion. She had some improvement in her symptoms with this.   The patient was seen earlier this spring by Rodman Pickle her primary doctor. She complained of progressive weakness, DOE. Echo was scheduled. This was done on 04/11/13. I have reviewed this echo LVEF was reported as 30 to 35% I think it may be better than this, around 40 to 45% The inferior and septal walls are hypokinetic. In comparison to echo from 2008 inferior changes are new. Aortic insuffuciency is rel unchanged (mild to moderate) MR appears more pronounced.  I saw her in 2023-05-13.  This was the first time since 2012  (she had problems with insurance and did not sched appts) She was SOB and had some volume increase.  With echo findings and worsening symptoms I recomm she had a R and L heart cath to define anatomy/pressures The patient and her husband adamantly refused to have a procedure done at [facility]  (billing problems in past)  I recomm that she be seen at [facility]  She has an appt  with Nolon Rod but not until AUg 29  The patient called and said she was very weak, breathing wasn't getting better.  Gives out with minimal acitvity.  Husband helps her get up, move around She denies CP  She come in today for labs    No Known Allergies  Current Outpatient Prescriptions  Medication Sig Dispense Refill  . furosemide (LASIX) 40 MG tablet TAKE ONE TABLET BY MOUTH EVERY DAY CAN TAKE ONE AND ONE-HALF TABLET AS NEEDED  135 tablet  3  . glimepiride (AMARYL) 4 MG tablet Take 1 tablet (4 mg total) by mouth daily before breakfast.  90 tablet  3  . metFORMIN (GLUCOPHAGE) 1000 MG tablet Take 1 tablet (1,000 mg total) by mouth 2 (two) times daily with a meal.  180 tablet  3  . metoprolol tartrate (LOPRESSOR) 25 MG tablet TAKE ONE-HALF TABLET BY MOUTH TWICE DAILY  90 tablet  3  . oxyCODONE-acetaminophen (ROXICET) 5-325 MG per tablet Take 1 tablet by mouth every 6 (six) hours as needed for pain. FILL ON OR AFTER 06/25/13  120 tablet  0  . simvastatin (ZOCOR) 40 MG tablet Take 1 tablet (40 mg total) by mouth daily.  90 tablet  3   No current facility-administered medications for this visit.    Past Medical History  Diagnosis Date  . Cancer 1982    s/p mastectomy and XRT  . Diabetes mellitus  type 2  . Hyperlipidemia   . Mitral insufficiency 2003    s/p annuloplasty   . Coronary artery disease 2003    s/p  CABG; S/P PTCA /STENT 11/2007  . DDD (degenerative disc disease), cervical   . 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 Cervical cancer   . Esophageal stricture   . Peripheral neuropathy     Past Surgical History  Procedure Laterality Date  . Appendectomy    . Coronary artery bypass graft    . Mv annulosplasty    . Cholecystectomy    . Abdominal hysterectomy    . Breast surgery  1980    LEFT BREAST  . Esophagogastroduodenoscopy  09/22/2000  . Spine surgery  2008    C-SPINE DISKECTOMY AND FUSION C6-7    Family History  Problem Relation Age of Onset  . Heart disease  Mother   . Mental illness Mother     DEMNTIA    History   Social History  . Marital Status: Married    Spouse Name: N/A    Number of Children: N/A  . Years of Education: N/A   Occupational History  . Not on file.   Social History Main Topics  . Smoking status: Never Smoker   . Smokeless tobacco: Not on file  . Alcohol Use: No  . Drug Use: No  . Sexual Activity: Not on file   Other Topics Concern  . 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  They are negative to the above problem except as previously stated.  Vital Signs: BP 100/60  Pulse 108  Ht 5\' 7"  (1.702 m)  Wt 167 lb (75.751 kg)  BMI 26.15 kg/m2  Physical Exam Patient appears weak but in NAD HEENT:  Normocephalic, atraumatic. EOMI, PERRLA.  Neck: JVP is increased  No bruits.  Lungs: clear to auscultation. No rales no wheezes.  Heart: Regular rate and rhythm. Normal S1, S2. No S3.  Gr II/VI systolic murmur.  Abdomen:  Supple, nontender. Normal bowel sounds. No masses. No hepatomegaly.  Extremities:   Good distal pulses throughout. Tr lower extremity edema.  Musculoskeletal :moving all extremities.  Neuro:   alert and oriented x3.  CN II-XII grossly intact.  EKG  Sinus tachycardia  108 bpm.  Occasional PAC  RBBB.  LAFB  RVH.    Assessment and Plan:  1  Acute on chronic systolic CHF  Patient's volume status appears to be mildly increased  But, lungs clear  It is not significantly different than when I saw her in July. Will get labs and dose lasix accordingly  Follow renal function Echo with LV function that is down from  previous  New wall motion abnormality  I recomm Cath to redefine anatomy and pressures. Again, will not have it done at [facility]. Refer to [facility]  2.  Mitral regurg.WIll be evaluated at [facility]  Moderate by echo.    3.  CAD  As above.  Patient should continue acitivities as tolerated   Will be in contact with her once the lab results are back.

## 2013-05-18 ENCOUNTER — Telehealth: Payer: Self-pay | Admitting: Internal Medicine

## 2013-05-18 NOTE — Telephone Encounter (Signed)
New prob  Pt states she has an upcoming appt with [facility] and she has misplaced the paper work that she was given. She was wondering if we still had the information.

## 2013-05-18 NOTE — Telephone Encounter (Signed)
Called patient and Jacqueline Lamb advised me that Jacqueline Lamb forgot when her appointment is with Dr.Wang. I called the [facility] clinic and they advised that her appointment is on 8/25 at 1:40 PM with Dr.Amdrew Regino Schultze at [facility] in [place]. With phone # of [phone]. Patient is aware of her appointment date, place, time and location.

## 2013-05-19 ENCOUNTER — Emergency Department (HOSPITAL_COMMUNITY): Payer: Medicare Other

## 2013-05-19 ENCOUNTER — Encounter (HOSPITAL_COMMUNITY): Payer: Self-pay | Admitting: *Deleted

## 2013-05-19 ENCOUNTER — Emergency Department (HOSPITAL_COMMUNITY)
Admission: EM | Admit: 2013-05-19 | Discharge: 2013-05-19 | Disposition: A | Discharge status: Home / Self Care | Payer: Medicare Other | Attending: Emergency Medicine | Admitting: Emergency Medicine

## 2013-05-19 DIAGNOSIS — Z8541 Personal history of malignant neoplasm of cervix uteri: Secondary | ICD-10-CM | POA: Insufficient documentation

## 2013-05-19 DIAGNOSIS — Z853 Personal history of malignant neoplasm of breast: Secondary | ICD-10-CM | POA: Insufficient documentation

## 2013-05-19 DIAGNOSIS — M5030 Other cervical disc degeneration, unspecified cervical region: Secondary | ICD-10-CM | POA: Insufficient documentation

## 2013-05-19 DIAGNOSIS — G609 Hereditary and idiopathic neuropathy, unspecified: Secondary | ICD-10-CM | POA: Insufficient documentation

## 2013-05-19 DIAGNOSIS — E785 Hyperlipidemia, unspecified: Secondary | ICD-10-CM | POA: Insufficient documentation

## 2013-05-19 DIAGNOSIS — R531 Weakness: Secondary | ICD-10-CM

## 2013-05-19 DIAGNOSIS — I2510 Atherosclerotic heart disease of native coronary artery without angina pectoris: Secondary | ICD-10-CM | POA: Insufficient documentation

## 2013-05-19 DIAGNOSIS — R5383 Other fatigue: Secondary | ICD-10-CM | POA: Insufficient documentation

## 2013-05-19 DIAGNOSIS — Z79899 Other long term (current) drug therapy: Secondary | ICD-10-CM | POA: Insufficient documentation

## 2013-05-19 DIAGNOSIS — R5381 Other malaise: Secondary | ICD-10-CM | POA: Insufficient documentation

## 2013-05-19 DIAGNOSIS — E1149 Type 2 diabetes mellitus with other diabetic neurological complication: Secondary | ICD-10-CM | POA: Insufficient documentation

## 2013-05-19 DIAGNOSIS — R4789 Other speech disturbances: Secondary | ICD-10-CM | POA: Insufficient documentation

## 2013-05-19 DIAGNOSIS — Z9861 Coronary angioplasty status: Secondary | ICD-10-CM | POA: Insufficient documentation

## 2013-05-19 LAB — CBC
Hemoglobin: 12.2 g/dL (ref 12.0–15.0)
MCH: 31.3 pg (ref 26.0–34.0)
MCHC: 32.5 g/dL (ref 30.0–36.0)
Platelets: 322 10*3/uL (ref 150–400)
RDW: 17.7 % — ABNORMAL HIGH (ref 11.5–15.5)

## 2013-05-19 LAB — COMPREHENSIVE METABOLIC PANEL
ALT: 16 U/L (ref 0–35)
Albumin: 3.6 g/dL (ref 3.5–5.2)
Calcium: 8.6 mg/dL (ref 8.4–10.5)
GFR calc Af Amer: 50 mL/min — ABNORMAL LOW (ref >90)
Glucose, Bld: 157 mg/dL — ABNORMAL HIGH (ref 70–99)
Sodium: 134 mEq/L — ABNORMAL LOW (ref 135–145)
Total Protein: 6.2 g/dL (ref 6.0–8.3)

## 2013-05-19 LAB — URINALYSIS, ROUTINE W REFLEX MICROSCOPIC
Bilirubin Urine: NEGATIVE
Glucose, UA: NEGATIVE mg/dL
Leukocytes, UA: NEGATIVE
Nitrite: NEGATIVE
Specific Gravity, Urine: 1.011 (ref 1.005–1.030)
pH: 5 (ref 5.0–8.0)

## 2013-05-19 LAB — GLUCOSE, CAPILLARY: Glucose-Capillary: 114 mg/dL — ABNORMAL HIGH (ref 70–99)

## 2013-05-19 NOTE — ED Provider Notes (Signed)
UA is negative.  Pt's family wish to be discharged to go to another facility for more definitive eval and treatment.  Gavin Pound. Oletta Lamas, MD 05/19/13 0981

## 2013-05-19 NOTE — ED Provider Notes (Addendum)
CSN: [id number]     Arrival date & time 05/19/13  1244 History     First MD Initiated Contact with Patient 05/19/13 1325     Chief Complaint  Patient presents with  . Emesis   (Consider location/radiation/quality/duration/timing/severity/associated sxs/prior Treatment) HPI Comments: 67 year old female presents emergency department with multiple vague complaints. Patient states her husband made her come to the emergency department at all she wanted to do was to rest. Patient was accompanied by her husband and her nephew. She agreed to history and physical with them present. Patient has history of diabetes for which he takes metformin and Amaryl. She also takes Lasix. Her prescriptions included Percocet and Valium. She denies fever, chills, head trauma, or neurologic symptoms. She does report one episode of vomiting. Her PCM is Dr. Balinda Quails, cardiologist Dr. Tenny Craw at [facility].  The patient denies paresthesias, paralysis, muscle weakness, but does endorse globalized diffuse weakness.  Patient is a 67 y.o. female presenting with vomiting and weakness. The history is provided by the patient and the spouse.  Emesis Severity:  Mild Timing:  Rare Number of daily episodes:  1 Quality:  Stomach contents Able to tolerate:  Liquids Progression:  Resolved Chronicity:  New Context: not post-tussive and not self-induced   Relieved by:  Nothing Worsened by:  Nothing tried Ineffective treatments:  None tried Associated symptoms: no abdominal pain, no arthralgias, no chills, no cough, no diarrhea, no fever and no headaches   Risk factors: diabetes   Risk factors: not pregnant now, no sick contacts, no suspect food intake and no travel to endemic areas   Weakness This is a new problem. The current episode started 2 days ago. The problem occurs constantly. The problem has not changed since onset.Pertinent negatives include no abdominal pain and no headaches. Nothing aggravates the symptoms. Nothing  relieves the symptoms. She has tried nothing for the symptoms.    Past Medical History  Diagnosis Date  . Cancer 1982    s/p mastectomy and XRT  . Diabetes mellitus     type 2  . Hyperlipidemia   . Mitral insufficiency 2003    s/p annuloplasty   . Coronary artery disease 2003    s/p  CABG; S/P PTCA /STENT 11/2007  . DDD (degenerative disc disease), cervical   . 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 Cervical cancer   . Esophageal stricture   . Peripheral neuropathy    Past Surgical History  Procedure Laterality Date  . Appendectomy    . Coronary artery bypass graft    . Mv annulosplasty    . Cholecystectomy    . Abdominal hysterectomy    . Breast surgery  1980    LEFT BREAST  . Esophagogastroduodenoscopy  09/22/2000  . Spine surgery  2008    C-SPINE DISKECTOMY AND FUSION C6-7   Family History  Problem Relation Age of Onset  . Heart disease Mother   . Mental illness Mother     DEMNTIA   History  Substance Use Topics  . Smoking status: Never Smoker   . Smokeless tobacco: Not on file  . Alcohol Use: No   OB History   Grav Para Term Preterm Abortions TAB SAB Ect Mult Living                 Review of Systems  Constitutional: Positive for activity change and fatigue. Negative for fever, chills and diaphoresis.  HENT: Negative.   Eyes: Negative.   Respiratory: Negative.  Cardiovascular: Negative.   Gastrointestinal: Positive for vomiting. Negative for abdominal pain, diarrhea, constipation, blood in stool and anal bleeding.  Endocrine: Negative.   Genitourinary: Negative.   Musculoskeletal: Negative for arthralgias.  Neurological: Negative for dizziness, seizures, facial asymmetry, speech difficulty, numbness and headaches.    Allergies  Review of patient's allergies indicates no known allergies.  Home Medications   Current Outpatient Rx  Name  Route  Sig  Dispense  Refill  . Artificial Saliva (BIOTENE ORALBALANCE DRY MOUTH) GEL   Mouth/Throat   Use as  directed 1 application in the mouth or throat daily as needed (dry mouth).         . docusate sodium (COLACE) 100 MG capsule   Oral   Take 2,000 mg by mouth at bedtime.          . Ferrous Sulfate 27 MG TABS   Oral   Take 27 mg by mouth daily.          . furosemide (LASIX) 40 MG tablet   Oral   Take 80 mg by mouth daily.          Marland Kitchen glimepiride (AMARYL) 4 MG tablet   Oral   Take 1 tablet (4 mg total) by mouth daily before breakfast.   90 tablet   3   . hydrocortisone cream 1 %   Topical   Apply 1 application topically daily as needed (itching).          . metFORMIN (GLUCOPHAGE) 1000 MG tablet   Oral   Take 500-1,000 mg by mouth 2 (two) times daily with a meal.         . metoprolol tartrate (LOPRESSOR) 25 MG tablet   Oral   Take 12.5 mg by mouth 2 (two) times daily.         Marland Kitchen omeprazole (PRILOSEC OTC) 20 MG tablet   Oral   Take 20 mg by mouth 2 (two) times daily.          Marland Kitchen oxyCODONE-acetaminophen (PERCOCET/ROXICET) 5-325 MG per tablet   Oral   Take 1 tablet by mouth daily as needed for pain.         . simvastatin (ZOCOR) 40 MG tablet   Oral   Take 40 mg by mouth at bedtime.         . trolamine salicylate (ASPERCREME) 10 % cream   Topical   Apply 1 application topically daily as needed (pain).          . vitamin B-12 (CYANOCOBALAMIN) 500 MCG tablet   Oral   Take 500 mcg by mouth daily.         Marland Kitchen amoxicillin (AMOXIL) 500 MG capsule   Oral   Take 2,000 mg by mouth See admin instructions. Take one hour before dental treatment          BP 111/64  Pulse 92  Resp 20  SpO2 100% Physical Exam  Constitutional: She is oriented to person, place, and time. She appears well-developed and well-nourished.  Patient is alert and oriented and able answer questions. Her speech is mildly slurred but she said this is because her mouth is dry. She was appropriate in response. She moved all extremities.  HENT:  Head: Normocephalic and atraumatic.   Eyes: Conjunctivae and EOM are normal. Pupils are equal, round, and reactive to light.  Neck: Normal range of motion. Neck supple.  Cardiovascular: Normal rate and regular rhythm.   Pulmonary/Chest: Effort normal and breath sounds normal.  Abdominal: Soft.  Bowel sounds are normal. She exhibits no distension. There is no tenderness. There is no rebound and no guarding.  Musculoskeletal: Normal range of motion.  No clubbing or cyanosis, clubbing all extremities, no point tenderness to palpation, mild bilateral lower extremity edema  Neurological: She is alert and oriented to person, place, and time. She displays normal reflexes. A cranial nerve deficit is present. She exhibits normal muscle tone. She displays no seizure activity. Coordination normal.  MS 5/5, pt moving all extremities,   Skin: Skin is warm.    ED Course   Procedures (including critical care time)  Labs Reviewed  GLUCOSE, CAPILLARY - Abnormal; Notable for the following:    Glucose-Capillary 114 (*)    All other components within normal limits  CBC - Abnormal; Notable for the following:    WBC 11.9 (*)    RDW 17.7 (*)    All other components within normal limits  COMPREHENSIVE METABOLIC PANEL - Abnormal; Notable for the following:    Sodium 134 (*)    Chloride 95 (*)    Glucose, Bld 157 (*)    BUN 44 (*)    Creatinine, Ser 1.28 (*)    GFR calc non Af Amer 43 (*)    GFR calc Af Amer 50 (*)    All other components within normal limits  TROPONIN I  URINALYSIS, ROUTINE W REFLEX MICROSCOPIC   Results for orders placed during the hospital encounter of 05/19/13  GLUCOSE, CAPILLARY      Result Value Range   Glucose-Capillary 114 (*) 70 - 99 mg/dL  CBC      Result Value Range   WBC 11.9 (*) 4.0 - 10.5 K/uL   RBC 3.90  3.87 - 5.11 MIL/uL   Hemoglobin 12.2  12.0 - 15.0 g/dL   HCT 62.1  30.8 - 65.7 %   MCV 96.2  78.0 - 100.0 fL   MCH 31.3  26.0 - 34.0 pg   MCHC 32.5  30.0 - 36.0 g/dL   RDW 84.6 (*) 96.2 - 95.2 %    Platelets 322  150 - 400 K/uL  COMPREHENSIVE METABOLIC PANEL      Result Value Range   Sodium 134 (*) 135 - 145 mEq/L   Potassium 4.6  3.5 - 5.1 mEq/L   Chloride 95 (*) 96 - 112 mEq/L   CO2 20  19 - 32 mEq/L   Glucose, Bld 157 (*) 70 - 99 mg/dL   BUN 44 (*) 6 - 23 mg/dL   Creatinine, Ser 8.41 (*) 0.50 - 1.10 mg/dL   Calcium 8.6  8.4 - 32.4 mg/dL   Total Protein 6.2  6.0 - 8.3 g/dL   Albumin 3.6  3.5 - 5.2 g/dL   AST 18  0 - 37 U/L   ALT 16  0 - 35 U/L   Alkaline Phosphatase 98  39 - 117 U/L   Total Bilirubin 0.9  0.3 - 1.2 mg/dL   GFR calc non Af Amer 43 (*) >90 mL/min   GFR calc Af Amer 50 (*) >90 mL/min  TROPONIN I      Result Value Range   Troponin I <0.30  <0.30 ng/mL   CXR: Negative for acute disease CT Head: no acute intracranial abnormality   Date: 05/19/2013  Rate: 90  Rhythm: normal sinus rhythm  QRS Axis: left  Intervals: normal  ST/T Wave abnormalities: nonspecific ST changes  Conduction Disutrbances:none  Narrative Interpretation: no clear ischemic changes  Old EKG Reviewed: changes noted  ED course: Patient's vital signs remained stable. Chest x-ray and CT head were negative, CBC unremarkable, troponin negative, CMP consistent with elevation in creatinine 1.28, Urinalysis pending.  Trial of ambulation refill the patient was unable to bear weight, and unable to ambulate. She states she is too weak to do this.  Based on progressive weakness plan to consult internal medicine for admission.    MDM   67 year old female presents to the emergency department via EMS for multiple vague symptoms. Vital signs stable at this time. Plan for ER workup to include head CT, chest x-ray, EKG, troponin, blood work, and urinalysis. Disposition will be pending results. Patient does have prescriptions for Valium and Percocet and these medications may have compounded her presenting symptoms. No evidence of acute narcosis or concern for airway management at this time.  4:10 PM Plan for  admission for obs and further management.  Urinalysis is pending.    5:12 PM Lengthy discussion with patient, her husband, inpatient physician consultant Dr. Viann Fish regarding disposition of the patient. Dr. Floy Sabina spoke with the patient's primary care doctor Dr. Arthur Holms who reports this weakness has been chronic and the patient needs to followup with cardiology. She is encouraged to follow with cardiology at [facility], however she and her family preferred to go to [facility]. Ultimate disposition will be discharged to home if urinalysis is negative or admitted to observation if urinalysis is positive. Initial urine specimen was not received by the lab so second specimen will need to be obtained. The patient and her husband are aware of this and are aware of disposition is based off the urinalysis results. Throughout ER stay the patient is no acute distress, vital signs are normal.  Patient turned over to Dr. Oletta Lamas pending urinalysis and ultimate disposition.  Darlys Gales, MD 05/19/13 1347  Darlys Gales, MD 05/19/13 [phone]

## 2013-05-19 NOTE — ED Notes (Signed)
Pt unable to ambulate to the end of the bed without total assistance from staff and family. Pts husband states he has had to help wife out of bed to restroom and is unable to provide full care of his wife at this time. MD Maseri aware.

## 2013-05-19 NOTE — ED Notes (Signed)
Husband at bedside with reported vague symptoms. Pt A/O x4 . Pt reports she does not feel good. Skin waxy and yellow in color . Pt 's Husband reports pt has been not her self for 4 weeks.

## 2013-05-19 NOTE — Discharge Instructions (Signed)
Weakness   Your exam shows you have weakness without a known cause. This may require further medical evaluation. Weakness can be caused by infections, physical exhaustion, internal bleeding, dehydration, medication side effects, and emotional upsets. Problems with circulation, heart disease, and nervous system disorders can also make you weak. Weakness can cause dizziness and fainting; this is usually due to a low blood pressure.   Full medical evaluation may require additional laboratory and x-ray tests, so be sure to see your doctor for follow up care as recommended. You should get plenty of rest and eat a nutritious diet over the next weeks. If you become very dizzy, nauseated, or feel like you are going to faint, please lie down flat right away and wait until all your symptoms have passed before you get up again. You should see your doctor or go to the emergency room right away if you have any of the following problems:   Severe headache, chest pain, or abdominal pain.   An irregular heartbeat or a very fast pulse (over 120 beats/minute).   Chest pain, chest pressure, and/or difficulty breathing.   Confusion, vision problems, difficulty walking, fever or chills.   Document Released: 09/21/2005 Document Revised: 09/10/2011 Document Reviewed: 03/03/2007   [employer]® Patient Information ©2012 [employer].

## 2013-05-19 NOTE — ED Notes (Signed)
Pt reports frequent hematuria and rectal bleeding for a long time . Pt unable to give an exact time frame . P t is using a cane for ambulation.

## 2013-05-24 ENCOUNTER — Telehealth: Payer: Self-pay

## 2013-05-24 NOTE — Telephone Encounter (Signed)
Received a call from Dr.Gehrig at [facility].He stated patient had a cardiac cath today 05/24/13.Patient's CHF bad.Cardiac index 1.1.Patient is not a candidate for valve surgery.She will be considered for transplant.Dr.Gehrig wanted Dr.Ross to know.Message sent to Dr.Ross.

## 2013-05-31 ENCOUNTER — Encounter: Payer: Self-pay | Admitting: *Deleted

## 2013-06-01 ENCOUNTER — Inpatient Hospital Stay: Admission: AD | Admit: 2013-06-01 | Payer: Self-pay | Source: Ambulatory Visit | Admitting: Internal Medicine

## 2013-06-06 ENCOUNTER — Emergency Department (HOSPITAL_COMMUNITY): Payer: Medicare Other

## 2013-06-06 ENCOUNTER — Encounter (HOSPITAL_COMMUNITY): Payer: Self-pay | Admitting: *Deleted

## 2013-06-06 ENCOUNTER — Other Ambulatory Visit: Payer: Self-pay

## 2013-06-06 ENCOUNTER — Inpatient Hospital Stay (HOSPITAL_COMMUNITY)
Admission: EM | Admit: 2013-06-06 | Discharge: 2013-07-05 | DRG: 292 | Disposition: E | Payer: Medicare Other | Attending: Internal Medicine | Admitting: Internal Medicine

## 2013-06-06 DIAGNOSIS — I5043 Acute on chronic combined systolic (congestive) and diastolic (congestive) heart failure: Principal | ICD-10-CM | POA: Diagnosis present

## 2013-06-06 DIAGNOSIS — R627 Adult failure to thrive: Secondary | ICD-10-CM | POA: Diagnosis present

## 2013-06-06 DIAGNOSIS — Z794 Long term (current) use of insulin: Secondary | ICD-10-CM

## 2013-06-06 DIAGNOSIS — G609 Hereditary and idiopathic neuropathy, unspecified: Secondary | ICD-10-CM | POA: Diagnosis present

## 2013-06-06 DIAGNOSIS — R570 Cardiogenic shock: Secondary | ICD-10-CM

## 2013-06-06 DIAGNOSIS — E119 Type 2 diabetes mellitus without complications: Secondary | ICD-10-CM

## 2013-06-06 DIAGNOSIS — I2510 Atherosclerotic heart disease of native coronary artery without angina pectoris: Secondary | ICD-10-CM

## 2013-06-06 DIAGNOSIS — Z9861 Coronary angioplasty status: Secondary | ICD-10-CM

## 2013-06-06 DIAGNOSIS — K222 Esophageal obstruction: Secondary | ICD-10-CM | POA: Diagnosis present

## 2013-06-06 DIAGNOSIS — Z66 Do not resuscitate: Secondary | ICD-10-CM | POA: Diagnosis not present

## 2013-06-06 DIAGNOSIS — I2582 Chronic total occlusion of coronary artery: Secondary | ICD-10-CM | POA: Diagnosis present

## 2013-06-06 DIAGNOSIS — I5022 Chronic systolic (congestive) heart failure: Secondary | ICD-10-CM

## 2013-06-06 DIAGNOSIS — Z79899 Other long term (current) drug therapy: Secondary | ICD-10-CM

## 2013-06-06 DIAGNOSIS — R079 Chest pain, unspecified: Secondary | ICD-10-CM

## 2013-06-06 DIAGNOSIS — R5381 Other malaise: Secondary | ICD-10-CM | POA: Diagnosis present

## 2013-06-06 DIAGNOSIS — Z8673 Personal history of transient ischemic attack (TIA), and cerebral infarction without residual deficits: Secondary | ICD-10-CM

## 2013-06-06 DIAGNOSIS — Z853 Personal history of malignant neoplasm of breast: Secondary | ICD-10-CM

## 2013-06-06 DIAGNOSIS — R579 Shock, unspecified: Secondary | ICD-10-CM | POA: Diagnosis not present

## 2013-06-06 DIAGNOSIS — M25519 Pain in unspecified shoulder: Secondary | ICD-10-CM | POA: Diagnosis present

## 2013-06-06 DIAGNOSIS — E871 Hypo-osmolality and hyponatremia: Secondary | ICD-10-CM

## 2013-06-06 DIAGNOSIS — Z9010 Acquired absence of unspecified breast and nipple: Secondary | ICD-10-CM

## 2013-06-06 DIAGNOSIS — Z923 Personal history of irradiation: Secondary | ICD-10-CM

## 2013-06-06 DIAGNOSIS — Z951 Presence of aortocoronary bypass graft: Secondary | ICD-10-CM

## 2013-06-06 DIAGNOSIS — M5030 Other cervical disc degeneration, unspecified cervical region: Secondary | ICD-10-CM

## 2013-06-06 DIAGNOSIS — G8929 Other chronic pain: Secondary | ICD-10-CM | POA: Diagnosis present

## 2013-06-06 DIAGNOSIS — Z8541 Personal history of malignant neoplasm of cervix uteri: Secondary | ICD-10-CM

## 2013-06-06 DIAGNOSIS — Z515 Encounter for palliative care: Secondary | ICD-10-CM

## 2013-06-06 DIAGNOSIS — I639 Cerebral infarction, unspecified: Secondary | ICD-10-CM

## 2013-06-06 DIAGNOSIS — E785 Hyperlipidemia, unspecified: Secondary | ICD-10-CM | POA: Diagnosis present

## 2013-06-06 HISTORY — DX: Other specified postprocedural states: R11.2

## 2013-06-06 HISTORY — DX: Adverse effect of unspecified anesthetic, initial encounter: T41.45XA

## 2013-06-06 HISTORY — DX: Cerebral infarction, unspecified: I63.9

## 2013-06-06 HISTORY — DX: Essential (primary) hypertension: I10

## 2013-06-06 HISTORY — DX: Other complications of anesthesia, initial encounter: T88.59XA

## 2013-06-06 HISTORY — DX: Heart failure, unspecified: I50.9

## 2013-06-06 HISTORY — DX: Other specified postprocedural states: Z98.890

## 2013-06-06 LAB — CBC WITH DIFFERENTIAL/PLATELET
Lymphocytes Relative: 12 % (ref 12–46)
Lymphs Abs: 1 10*3/uL (ref 0.7–4.0)
Neutrophils Relative %: 79 % — ABNORMAL HIGH (ref 43–77)
Platelets: 283 10*3/uL (ref 150–400)
RBC: 3.92 MIL/uL (ref 3.87–5.11)
WBC: 8.6 10*3/uL (ref 4.0–10.5)

## 2013-06-06 LAB — GLUCOSE, CAPILLARY
Glucose-Capillary: 138 mg/dL — ABNORMAL HIGH (ref 70–99)
Glucose-Capillary: 85 mg/dL (ref 70–99)
Glucose-Capillary: 165 mg/dL — ABNORMAL HIGH (ref 70–99)

## 2013-06-06 LAB — CARBOXYHEMOGLOBIN
Carboxyhemoglobin: 1.9 % — ABNORMAL HIGH (ref 0.5–1.5)
O2 Saturation: 63.7 %

## 2013-06-06 LAB — HEMOGLOBIN A1C
Hgb A1c MFr Bld: 7 % — ABNORMAL HIGH (ref <5.7)
Mean Plasma Glucose: 154 mg/dL — ABNORMAL HIGH (ref <117)

## 2013-06-06 LAB — POCT I-STAT 3, VENOUS BLOOD GAS (G3P V)
TCO2: 27 mmol/L (ref 0–100)
pCO2, Ven: 39.9 mmHg — ABNORMAL LOW (ref 45.0–50.0)
pH, Ven: 7.412 — ABNORMAL HIGH (ref 7.250–7.300)

## 2013-06-06 LAB — PRO B NATRIURETIC PEPTIDE: Pro B Natriuretic peptide (BNP): 2509 pg/mL — ABNORMAL HIGH (ref 0–125)

## 2013-06-06 LAB — POCT I-STAT, CHEM 8
BUN: 18 mg/dL (ref 6–23)
Creatinine, Ser: 1.6 mg/dL — ABNORMAL HIGH (ref 0.50–1.10)
Sodium: 127 mEq/L — ABNORMAL LOW (ref 135–145)
TCO2: 26 mmol/L (ref 0–100)

## 2013-06-06 MED ORDER — SENNOSIDES-DOCUSATE SODIUM 8.6-50 MG PO TABS
2.0000 | ORAL_TABLET | Freq: Two times a day (BID) | ORAL | Status: DC
  Administered 2013-06-06 – 2013-06-07 (×3): 2 via ORAL
  Filled 2013-06-06 (×3): qty 2

## 2013-06-06 MED ORDER — SODIUM CHLORIDE 0.9 % IV SOLN
INTRAVENOUS | Status: DC
  Administered 2013-06-06: 12:00:00 5 mL/h via INTRAVENOUS

## 2013-06-06 MED ORDER — SPIRONOLACTONE 12.5 MG HALF TABLET
12.5000 mg | ORAL_TABLET | Freq: Every day | ORAL | Status: DC
  Administered 2013-06-06 – 2013-06-07 (×2): 12.5 mg via ORAL
  Filled 2013-06-06 (×2): qty 1

## 2013-06-06 MED ORDER — CARBIDOPA-LEVODOPA ER 25-100 MG PO TBCR
1.0000 | EXTENDED_RELEASE_TABLET | Freq: Every day | ORAL | Status: DC
  Administered 2013-06-06 – 2013-06-07 (×2): 1 via ORAL
  Filled 2013-06-06 (×2): qty 1

## 2013-06-06 MED ORDER — AMIODARONE HCL 200 MG PO TABS
200.0000 mg | ORAL_TABLET | Freq: Every day | ORAL | Status: DC
  Administered 2013-06-06 – 2013-06-07 (×2): 200 mg via ORAL
  Filled 2013-06-06 (×2): qty 1

## 2013-06-06 MED ORDER — ASPIRIN 81 MG PO CHEW
81.0000 mg | CHEWABLE_TABLET | Freq: Every day | ORAL | Status: DC
  Administered 2013-06-06 – 2013-06-07 (×2): 81 mg via ORAL
  Filled 2013-06-06 (×2): qty 1

## 2013-06-06 MED ORDER — MIRTAZAPINE 7.5 MG PO TABS
7.5000 mg | ORAL_TABLET | Freq: Every day | ORAL | Status: DC
  Administered 2013-06-06: 7.5 mg via ORAL
  Filled 2013-06-06 (×2): qty 1

## 2013-06-06 MED ORDER — INSULIN ASPART 100 UNIT/ML ~~LOC~~ SOLN
3.0000 [IU] | Freq: Three times a day (TID) | SUBCUTANEOUS | Status: DC
  Administered 2013-06-06 – 2013-06-07 (×2): 3 [IU] via SUBCUTANEOUS

## 2013-06-06 MED ORDER — DOCUSATE SODIUM 100 MG PO CAPS
100.0000 mg | ORAL_CAPSULE | Freq: Two times a day (BID) | ORAL | Status: DC | PRN
  Filled 2013-06-06: qty 1

## 2013-06-06 MED ORDER — SODIUM CHLORIDE 0.9 % IJ SOLN
3.0000 mL | Freq: Two times a day (BID) | INTRAMUSCULAR | Status: DC
  Administered 2013-06-06: 10:00:00 3 mL via INTRAVENOUS
  Administered 2013-06-06: 10 mL via INTRAVENOUS

## 2013-06-06 MED ORDER — HEPARIN SODIUM (PORCINE) 5000 UNIT/ML IJ SOLN
5000.0000 [IU] | Freq: Three times a day (TID) | INTRAMUSCULAR | Status: DC
  Administered 2013-06-06 – 2013-06-07 (×4): 5000 [IU] via SUBCUTANEOUS
  Filled 2013-06-06 (×7): qty 1

## 2013-06-06 MED ORDER — SODIUM CHLORIDE 0.9 % IJ SOLN
10.0000 mL | INTRAMUSCULAR | Status: DC | PRN
  Administered 2013-06-06 (×2): 10 mL

## 2013-06-06 MED ORDER — OXYCODONE-ACETAMINOPHEN 5-325 MG PO TABS
1.0000 | ORAL_TABLET | ORAL | Status: DC | PRN
  Administered 2013-06-06: 2 via ORAL
  Administered 2013-06-06 (×3): 1 via ORAL
  Filled 2013-06-06 (×2): qty 1
  Filled 2013-06-06: qty 2
  Filled 2013-06-06: qty 1

## 2013-06-06 MED ORDER — TRAMADOL HCL 50 MG PO TABS: 25.0000 mg | ORAL_TABLET | Freq: Four times a day (QID) | ORAL | Status: DC | PRN

## 2013-06-06 MED ORDER — SIMETHICONE 80 MG PO CHEW
80.0000 mg | CHEWABLE_TABLET | Freq: Four times a day (QID) | ORAL | Status: DC | PRN
  Filled 2013-06-06 (×2): qty 1

## 2013-06-06 MED ORDER — OXYCODONE-ACETAMINOPHEN 5-325 MG PO TABS: 1.0000 | ORAL_TABLET | Freq: Four times a day (QID) | ORAL | Status: DC | PRN

## 2013-06-06 MED ORDER — SODIUM CHLORIDE 0.9 % IV SOLN
INTRAVENOUS | Status: DC
  Administered 2013-06-06: 21:00:00 via INTRAVENOUS

## 2013-06-06 MED ORDER — TORSEMIDE 20 MG PO TABS
40.0000 mg | ORAL_TABLET | Freq: Every day | ORAL | Status: DC
  Administered 2013-06-06 – 2013-06-07 (×2): 40 mg via ORAL
  Filled 2013-06-06 (×2): qty 2

## 2013-06-06 MED ORDER — INSULIN ASPART 100 UNIT/ML ~~LOC~~ SOLN
0.0000 [IU] | Freq: Three times a day (TID) | SUBCUTANEOUS | Status: DC
  Administered 2013-06-06 – 2013-06-07 (×2): 2 [IU] via SUBCUTANEOUS

## 2013-06-06 MED ORDER — FERROUS SULFATE 325 (65 FE) MG PO TABS
325.0000 mg | ORAL_TABLET | Freq: Every day | ORAL | Status: DC
  Administered 2013-06-06 – 2013-06-07 (×2): 325 mg via ORAL
  Filled 2013-06-06 (×3): qty 1

## 2013-06-06 MED ORDER — LISINOPRIL 5 MG PO TABS
5.0000 mg | ORAL_TABLET | Freq: Every day | ORAL | Status: DC
  Administered 2013-06-06: 5 mg via ORAL
  Filled 2013-06-06 (×2): qty 1

## 2013-06-06 MED ORDER — MELATONIN 3 MG PO TABS: 1.0000 | ORAL_TABLET | Freq: Every day | ORAL | Status: DC

## 2013-06-06 MED ORDER — MILRINONE IN DEXTROSE 20 MG/100ML IV SOLN
0.2500 ug/kg/min | INTRAVENOUS | Status: DC
  Administered 2013-06-06 – 2013-06-07 (×2): 0.25 ug/kg/min via INTRAVENOUS
  Filled 2013-06-06 (×2): qty 100

## 2013-06-06 MED ORDER — VITAMIN B-12 1000 MCG PO TABS
1000.0000 ug | ORAL_TABLET | Freq: Every day | ORAL | Status: DC
  Administered 2013-06-06 – 2013-06-07 (×2): 1000 ug via ORAL
  Filled 2013-06-06 (×2): qty 1

## 2013-06-06 MED ORDER — PANTOPRAZOLE SODIUM 40 MG PO TBEC
40.0000 mg | DELAYED_RELEASE_TABLET | Freq: Two times a day (BID) | ORAL | Status: DC
  Administered 2013-06-06 – 2013-06-07 (×3): 40 mg via ORAL
  Filled 2013-06-06 (×3): qty 1

## 2013-06-06 MED ORDER — INSULIN GLARGINE 100 UNIT/ML ~~LOC~~ SOLN
8.0000 [IU] | Freq: Every day | SUBCUTANEOUS | Status: DC
  Administered 2013-06-06: 8 [IU] via SUBCUTANEOUS
  Filled 2013-06-06 (×2): qty 0.08

## 2013-06-06 MED ORDER — DOPAMINE-DEXTROSE 3.2-5 MG/ML-% IV SOLN
2.0000 ug/kg/min | INTRAVENOUS | Status: DC
  Administered 2013-06-06: 5 ug/kg/min via INTRAVENOUS
  Filled 2013-06-06: qty 250

## 2013-06-06 MED ORDER — CIPROFLOXACIN HCL 250 MG PO TABS
250.0000 mg | ORAL_TABLET | Freq: Two times a day (BID) | ORAL | Status: DC
  Administered 2013-06-06 – 2013-06-07 (×3): 250 mg via ORAL
  Filled 2013-06-06 (×4): qty 1

## 2013-06-06 NOTE — Progress Notes (Signed)
Patient has been transferred to stepdown unit per cardiologist, patient's husband has been notified and report given to stepdown nurse.  Lorretta Harp RN

## 2013-06-06 NOTE — ED Notes (Signed)
Per EMS: pt coming from home with c/o central chest pressure, 6/10, sudden onset, non radiating, non provoking. Pt on Milrinone LACT pump at 20 mg. Pt dx with CHF and waiting for heart transplant. Pt is A&O x 4, respirations equal and unlabored, skin warm and dry. 12 lead unremarkable.

## 2013-06-06 NOTE — Evaluation (Signed)
Physical Therapy Evaluation Patient Details Name: Jacqueline Lamb MRN: [id number] DOB: 03/05/1946 Today's Date: 06/07/2013 Time: 4098-1191 PT Time Calculation (min): 47 min  PT Assessment / Plan / Recommendation History of Present Illness  67 y.o. female who presents with chest pain.  Pain is located in her L chest with radiation to her L arm, she states it has been going on for the past 1 hour associated with shortness of breath.  This is occuring in the context of known CAD, end stage CHF with complete occlusion of the patients LAD just distal to her RIMA CABG as demonstrated on cardiac cath on 05/23/13 at [facility].  Unfortunately due to generalized deconditioning and poor rehab potential the patient is not felt to be a candidate for BI-VAD or transplant at this time.  After talking to patient is actually in the ED it seems because her husband is unable to care for her at this point in time and so she requires SNF placement and will be given a chance to undergo rehab.  Clinical Impression  Pt with noted L sided weakness, generalized deconditioning and increased lethargy. Pt requires significant assist at this time with mobility and ADLs. Pt's desires are questionable as spouse reports he isn't sure if patient desires to go to rehab to get stronger for a heart transplant. Spoke extensively with spouse regarding discharge plans. Per SW pt can only go to a specific facility in [place] due to insurance complications. Spoke with RN regarding benefit of palliative consult who intern discussed with Dr. Debby Bud who decided against consult.    PT Assessment  Patient needs continued PT services    Follow Up Recommendations  SNF    Does the patient have the potential to tolerate intense rehabilitation      Barriers to Discharge Decreased caregiver support spouse unable to physically care for pt at this time    Equipment Recommendations       Recommendations for Other Services     Frequency Min 3X/week     Precautions / Restrictions Precautions Precautions: Fall Restrictions Weight Bearing Restrictions: No   Pertinent Vitals/Pain Pt reports L side of body to feel weird.      Mobility  Bed Mobility Bed Mobility: Supine to Sit Supine to Sit: 3: Mod assist;HOB elevated;With rails Details for Bed Mobility Assistance: max verbal and tactile cues to complete task Transfers Transfers: Sit to Stand;Stand to Sit;Stand Pivot Transfers Sit to Stand: 2: Max assist;With upper extremity assist;From bed Stand to Sit: 3: Mod assist;With upper extremity assist;To chair/3-in-1 Stand Pivot Transfers: 2: Max assist Details for Transfer Assistance: pt anxious, max directional v/c's, pt with L Knee locked in ext Ambulation/Gait Ambulation/Gait Assistance: Not tested (comment)    Exercises     PT Diagnosis: Difficulty walking;Generalized weakness  PT Problem List: Decreased strength;Decreased activity tolerance;Decreased balance;Decreased mobility PT Treatment Interventions: Gait training;DME instruction;Functional mobility training;Therapeutic activities;Therapeutic exercise     PT Goals(Current goals can be found in the care plan section) Acute Rehab PT Goals Patient Stated Goal: did not state PT Goal Formulation: With patient/family Time For Goal Achievement: 06/20/13 Potential to Achieve Goals: Good  Visit Information  Last PT Received On: 06/05/2013 Assistance Needed: +1 History of Present Illness: 67 y.o. female who presents with chest pain.  Pain is located in her L chest with radiation to her L arm, she states it has been going on for the past 1 hour associated with shortness of breath.  This is occuring in the context of known  CAD, end stage CHF with complete occlusion of the patients LAD just distal to her RIMA CABG as demonstrated on cardiac cath on 05/23/13 at [facility].  Unfortunately due to generalized deconditioning and poor rehab potential the patient is not felt to be a candidate for  BI-VAD or transplant at this time.  After talking to patient is actually in the ED it seems because her husband is unable to care for her at this point in time and so she requires SNF placement and will be given a chance to undergo rehab.       Prior Functioning  Home Living Family/patient expects to be discharged to:: Skilled nursing facility Prior Function Level of Independence: Needs assistance Gait / Transfers Assistance Needed: minimal amb <20' with RW, used w/c most of time ADL's / Homemaking Assistance Needed: spouse assist with all transfers and ADLs Communication Communication: No difficulties Dominant Hand: Right    Cognition  Cognition Arousal/Alertness: Lethargic Behavior During Therapy: Flat affect;Anxious (anxious re: where husband was) Overall Cognitive Status: Impaired/Different from baseline Area of Impairment: Orientation;Safety/judgement Orientation Level: Disoriented to;Place;Time;Situation Difficult to assess due to: Level of arousal    Extremity/Trunk Assessment Upper Extremity Assessment Upper Extremity Assessment: LUE deficits/detail LUE Deficits / Details: grossly 3/5, spouse reports pt having had small stroke a couple of weeks ago LUE Sensation:  (pt reports it to feel numb/dead) LUE Coordination: decreased fine motor;decreased gross motor (impaired opposition, and finger to nose) Lower Extremity Assessment Lower Extremity Assessment: LLE deficits/detail LLE Deficits / Details: grossly 4-/5 Cervical / Trunk Assessment Cervical / Trunk Assessment:  (max v/c's to hold head up)   Balance Balance Balance Assessed: Yes Static Sitting Balance Static Sitting - Balance Support: Bilateral upper extremity supported Static Sitting - Level of Assistance: 5: Stand by assistance Static Sitting - Comment/# of Minutes: 5 min, max v/c's to maintain eye opening and head up  End of Session PT - End of Session Equipment Utilized During Treatment: Gait belt Activity  Tolerance: Patient limited by lethargy Patient left: in chair;with call bell/phone within reach;with nursing/sitter in room;with family/visitor present Nurse Communication: Mobility status (RN present during session)  GP     Marcene Brawn 06/16/2013, 12:57 PM  Lewis Shock, PT, DPT Pager #: [phone] Office #: [phone]

## 2013-06-06 NOTE — Progress Notes (Signed)
Cosigned for [employer] assessment, I/O, notes, care plan/education etc. Lorretta Harp RN

## 2013-06-06 NOTE — Progress Notes (Signed)
Patient's bladder was scanned and patient had more than 500 cc in bladder; completed in and out cath on patient and removed 600cc of urine from patient's bladder.  Patient's husband states that he does this every 6hrs; will continue to monitor patient.  Lorretta Harp RN

## 2013-06-06 NOTE — Progress Notes (Signed)
    I spoke to Dr. Antoine Poche this evening.  Jacqueline Lamb has been hypotensive today.  He has ordered her to be transferred to the CCU and to start Dopamine.   I arrived on 22 ([address] [place]) and she was being transported to [facility] ( 2900).  Dopamine was to be started upon arrival to the unit.  She is asymptomatic at present.  She is worried that her husband may have difficulty finding her.  I have reassured her that the staff of [facility] will notify him of her new location.  I will sign her out to our cross cover fellow ( Dr. Mayford Knife)  [name], [name], MD, [place] 06/20/2013, 8:06 PM Office - [phone] Pager [phone]

## 2013-06-06 NOTE — ED Provider Notes (Signed)
CSN: [id number]     Arrival date & time 06/20/2013  0044 History   First MD Initiated Contact with Patient 07/01/2013 0047     Chief Complaint  Patient presents with  . Chest Pain   (Consider location/radiation/quality/duration/timing/severity/associated sxs/prior Treatment) Patient is a 67 y.o. female presenting with chest pain. The history is provided by the patient. No language interpreter was used.  Chest Pain Pain location:  L chest Pain quality: pressure   Pain radiates to:  L arm Pain severity:  Moderate Onset quality:  Sudden Duration:  1 hour Timing:  Constant Progression:  Unchanged Context: at rest   Relieved by:  Nothing Worsened by:  Nothing tried Ineffective treatments:  None tried Associated symptoms: shortness of breath   Associated symptoms: no cough, no fever and not vomiting   Risk factors: coronary artery disease   Risk factors: no smoking     Past Medical History  Diagnosis Date  . Cancer 1982    s/p mastectomy and XRT  . Diabetes mellitus     type 2  . Hyperlipidemia   . Mitral insufficiency 2003    s/p annuloplasty   . Coronary artery disease 2003    s/p  CABG; S/P PTCA /STENT 11/2007  . DDD (degenerative disc disease), cervical   . 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 Cervical cancer   . Esophageal stricture   . Peripheral neuropathy    Past Surgical History  Procedure Laterality Date  . Appendectomy    . Coronary artery bypass graft    . Mv annulosplasty    . Cholecystectomy    . Abdominal hysterectomy    . Breast surgery  1980    LEFT BREAST  . Esophagogastroduodenoscopy  09/22/2000  . Spine surgery  2008    C-SPINE DISKECTOMY AND FUSION C6-7   Family History  Problem Relation Age of Onset  . Heart disease Mother   . Mental illness Mother     DEMNTIA   History  Substance Use Topics  . Smoking status: Never Smoker   . Smokeless tobacco: Not on file  . Alcohol Use: No   OB History   Grav Para Term Preterm Abortions TAB SAB Ect Mult  Living                 Review of Systems  Constitutional: Negative for fever.  Respiratory: Positive for shortness of breath. Negative for cough.   Cardiovascular: Positive for chest pain. Negative for leg swelling.  Gastrointestinal: Negative for vomiting.  All other systems reviewed and are negative.    Allergies  Review of patient's allergies indicates no known allergies.  Home Medications   Current Outpatient Rx  Name  Route  Sig  Dispense  Refill  . amoxicillin (AMOXIL) 500 MG capsule   Oral   Take 2,000 mg by mouth See admin instructions. Take one hour before dental treatment         . Artificial Saliva (BIOTENE ORALBALANCE DRY MOUTH) GEL   Mouth/Throat   Use as directed 1 application in the mouth or throat daily as needed (dry mouth).         . docusate sodium (COLACE) 100 MG capsule   Oral   Take 2,000 mg by mouth at bedtime.          . Ferrous Sulfate 27 MG TABS   Oral   Take 27 mg by mouth daily.          . furosemide (LASIX)  40 MG tablet   Oral   Take 80 mg by mouth daily.          Marland Kitchen glimepiride (AMARYL) 4 MG tablet   Oral   Take 1 tablet (4 mg total) by mouth daily before breakfast.   90 tablet   3   . hydrocortisone cream 1 %   Topical   Apply 1 application topically daily as needed (itching).          . metFORMIN (GLUCOPHAGE) 1000 MG tablet   Oral   Take 500-1,000 mg by mouth 2 (two) times daily with a meal.         . metoprolol tartrate (LOPRESSOR) 25 MG tablet   Oral   Take 12.5 mg by mouth 2 (two) times daily.         Marland Kitchen omeprazole (PRILOSEC OTC) 20 MG tablet   Oral   Take 20 mg by mouth 2 (two) times daily.          Marland Kitchen oxyCODONE-acetaminophen (PERCOCET/ROXICET) 5-325 MG per tablet   Oral   Take 1 tablet by mouth daily as needed for pain.         . simvastatin (ZOCOR) 40 MG tablet   Oral   Take 40 mg by mouth at bedtime.         . trolamine salicylate (ASPERCREME) 10 % cream   Topical   Apply 1 application  topically daily as needed (pain).          . vitamin B-12 (CYANOCOBALAMIN) 500 MCG tablet   Oral   Take 500 mcg by mouth daily.          BP 129/55  Pulse 101  Temp(Src) 98 F (36.7 C) (Oral)  Resp 16  SpO2 100% Physical Exam  Constitutional: She is oriented to person, place, and time. She appears well-developed and well-nourished.  HENT:  Head: Normocephalic and atraumatic.  Mouth/Throat: Oropharynx is clear and moist.  Eyes: Conjunctivae are normal. Pupils are equal, round, and reactive to light.  Neck: Normal range of motion. Neck supple.  Cardiovascular: Normal rate, regular rhythm and intact distal pulses.   Pulmonary/Chest: Effort normal and breath sounds normal. She has no wheezes. She has no rales.  Abdominal: Soft. Bowel sounds are normal. There is no tenderness. There is no rebound and no guarding.  Musculoskeletal: Normal range of motion.  Neurological: She is alert and oriented to person, place, and time.  Skin: Skin is warm and dry. She is not diaphoretic.  Psychiatric: She has a normal mood and affect.    ED Course  Procedures (including critical care time) Labs Review Labs Reviewed  CBC WITH DIFFERENTIAL  PRO B NATRIURETIC PEPTIDE   Imaging Review No results found.  MDM   Date: 06/20/2013  Rate: 101  Rhythm: sinus tachycardia  QRS Axis: left  Intervals: PR prolonged  ST/T Wave abnormalities: nonspecific ST changes  Conduction Disutrbances:first-degree A-V block   Narrative Interpretation:   Old EKG Reviewed: changes noted      Yehonatan Grandison Smitty Cords, MD 06/05/2013 0981

## 2013-06-06 NOTE — Progress Notes (Signed)
Patient admitted from home where she lives with her husband; patient just released from [facility]. She returned home with Milrinone and Home Health services- however- due to the holiday weekend, there were issues with provider services.  Patient expressed complaints of chest pains and was brought to the hospital.  Husband states that he wants her to receive rehab services prior to bringing her back home and they agree to SNF. [facility] SW had already arranged for placement at [facility] in [place] which is the only facility that is in network for patient.  CSW will coordinate with SNF, the insurance Liason and patient/husband. SW Psychosocial assessment to follow.  Lorri Frederick. [place] Pugh  [phone]

## 2013-06-06 NOTE — Progress Notes (Signed)
SUBJECTIVE:  Patient admitted early this AM.  Seen in consultation by Dr. Gala Romney.     PHYSICAL EXAM Filed Vitals:   07/03/2013 0515 06/29/2013 0530 06/17/2013 0545 06/28/2013 0639  BP: 124/63 125/57 104/57 118/52  Pulse: 102 102 95 83  Temp:    98.4 F (36.9 C)  TempSrc:    Oral  Resp: 23 23 29 20   Height:    5\' 6"  (1.676 m)  Weight:    153 lb 1.6 oz (69.446 kg)  SpO2: 100% 99% 99% 99%   General:  No acute distress Lungs:  Decreased breath sounds Heart:  RRR Abdomen:  Positive bowel sounds, no rebound no guarding Extremities:  No edema  LABS:  Results for orders placed during the hospital encounter of 06/12/2013 (from the past 24 hour(s))  CBC WITH DIFFERENTIAL     Status: Abnormal   Collection Time    06/12/2013 12:52 AM      Result Value Range   WBC 8.6  4.0 - 10.5 K/uL   RBC 3.92  3.87 - 5.11 MIL/uL   Hemoglobin 12.2  12.0 - 15.0 g/dL   HCT 16.1  09.6 - 04.5 %   MCV 91.8  78.0 - 100.0 fL   MCH 31.1  26.0 - 34.0 pg   MCHC 33.9  30.0 - 36.0 g/dL   RDW 40.9 (*) 81.1 - 91.4 %   Platelets 283  150 - 400 K/uL   Neutrophils Relative % 79 (*) 43 - 77 %   Neutro Abs 6.8  1.7 - 7.7 K/uL   Lymphocytes Relative 12  12 - 46 %   Lymphs Abs 1.0  0.7 - 4.0 K/uL   Monocytes Relative 8  3 - 12 %   Monocytes Absolute 0.7  0.1 - 1.0 K/uL   Eosinophils Relative 1  0 - 5 %   Eosinophils Absolute 0.1  0.0 - 0.7 K/uL   Basophils Relative 1  0 - 1 %   Basophils Absolute 0.1  0.0 - 0.1 K/uL  GLUCOSE, CAPILLARY     Status: Abnormal   Collection Time    06/26/2013  1:47 AM      Result Value Range   Glucose-Capillary 171 (*) 70 - 99 mg/dL  PRO B NATRIURETIC PEPTIDE     Status: Abnormal   Collection Time    06/30/2013  1:48 AM      Result Value Range   Pro B Natriuretic peptide (BNP) 2509.0 (*) 0 - 125 pg/mL  POCT I-STAT TROPONIN I     Status: None   Collection Time    06/14/2013  1:55 AM      Result Value Range   Troponin i, poc 0.06  0.00 - 0.08 ng/mL   Comment 3           POCT I-STAT,  CHEM 8     Status: Abnormal   Collection Time    06/22/2013  1:56 AM      Result Value Range   Sodium 127 (*) 135 - 145 mEq/L   Potassium 4.0  3.5 - 5.1 mEq/L   Chloride 92 (*) 96 - 112 mEq/L   BUN 18  6 - 23 mg/dL   Creatinine, Ser 7.82 (*) 0.50 - 1.10 mg/dL   Glucose, Bld 956 (*) 70 - 99 mg/dL   Calcium, Ion 2.13 (*) 1.13 - 1.30 mmol/L   TCO2 26  0 - 100 mmol/L   Hemoglobin 12.9  12.0 - 15.0 g/dL   HCT  38.0  36.0 - 46.0 %  CARBOXYHEMOGLOBIN     Status: Abnormal   Collection Time    28-Jun-2013  3:09 AM      Result Value Range   Total hemoglobin 11.5 (*) 12.0 - 16.0 g/dL   O2 Saturation 40.9     Carboxyhemoglobin 1.9 (*) 0.5 - 1.5 %   Methemoglobin 0.6  0.0 - 1.5 %  POCT I-STAT 3, BLOOD GAS (G3P V)     Status: Abnormal   Collection Time    Jun 28, 2013  3:10 AM      Result Value Range   pH, Ven 7.412 (*) 7.250 - 7.300   pCO2, Ven 39.9 (*) 45.0 - 50.0 mmHg   pO2, Ven 34.0  30.0 - 45.0 mmHg   Bicarbonate 25.4 (*) 20.0 - 24.0 mEq/L   TCO2 27  0 - 100 mmol/L   O2 Saturation 66.0     Acid-Base Excess 1.0  0.0 - 2.0 mmol/L   Sample type VENOUS     Comment NOTIFIED PHYSICIAN      Intake/Output Summary (Last 24 hours) at 2013/06/28 0804 Last data filed at 2013/06/28 0250  Gross per 24 hour  Intake      0 ml  Output      8 ml  Net     -8 ml    ASSESSMENT AND PLAN:  ACUTE ON CHRONIC SYSTOLIC AND DIASTOLIC HF:  Na is down.  Continue current therapy for now. We will follow the electrolytes closely.  Continue IV milrinone.  No further invasive evaluation is planned at this point.  We will follow.  Fayrene Fearing [facility] 2013-06-28 8:04 AM

## 2013-06-06 NOTE — Consult Note (Addendum)
PCP: [facility] Primary Cardiologist: Tenny Craw Reason for Consultation:    HPI:  Jacqueline Lamb is a 67 y/o woman with DM2, breast CA s/p mastectomy and XRT. She also has has a history of CAD and severe MR She is s/p status post CABG x 1 (RIMA to LAD) and mitral valve repair in 2003.  She recently saw Dr. Tenny Craw with worsening DOE and fatigue. Referred to [facility] (at patient's request) for cath.   At [facility] with EF 30% mod MR, AR. Moderate RV dysfunction  LHC/RHC with stable CAD and severe biventricular HF.   LM: nl LAD: 70%m 100%d RIMA - LAD: patent but LAD occluded distally just after insertion. LCX: 50%m, OM1 20%,30% RCA: nl  RA 15 RV 56/19 PA 56/30 (35) PCWP 26 v =29 No constriction Fick CI 1.13  Started on milrinone and discharged home with the thought that she may be a transplant candidate if she gets stronger. They recommended Inpatient Rehab vs SNF but she refused as Jacqueline Lamb would not pay for CIR. Got home on Saturday. Was feeling very weak. Unable to stand to weigh herself. Tonight had some chest tightness while lying down. No orthopnea or PND.  Weight now 155 which is below baseline. Unable to get out of bed or do any ADLs by herself. Husband has been giving her all meds and even straight cathing her because she can't go to the bathroom. C/o chronic pain throughout her back and shoulders asking for oxycontin.  Labs stable:  co-ox 66%. pBNP 2509 (previous 1562)  Review of Systems:     Cardiac Review of Systems: {Y] = yes [ ]  = no  Chest Pain Cove.Etienne    ]  Resting SOB [   ] Exertional SOB  Cove.Etienne  ]  Kenese.Mounts [  ]   Pedal Edema [   ]    Palpitations [  ] Syncope  [  ]   Presyncope [   ]  General Review of Systems: [Y] = yes [  ]=no Constitional: recent weight change [  ]; anorexia [  ]; fatigue [  ]; nausea [  ]; night sweats [  ]; fever [  ]; or chills [  ];                                                                                                                                           Dental: poor dentition[  ];   Eye : blurred vision [  ]; diplopia [   ]; vision changes [  ];  Amaurosis fugax[  ]; Resp: cough [  ];  wheezing[  ];  hemoptysis[  ]; shortness of breath[  ]; paroxysmal nocturnal dyspnea[  ]; dyspnea on exertion[ y ]; or orthopnea[  ];  GI:  gallstones[  ], vomiting[  ];  dysphagia[  ]; melena[  ];  hematochezia [  ]; heartburn[  ];  Hx of  Colonoscopy[  ]; GU: kidney stones [  ]; hematuria[  ];   dysuria [  ];  nocturia[  ];  history of     obstruction [  ];                 Skin: rash, swelling[  ];, hair loss[  ];  peripheral edema[  ];  or itching[  ]; Musculosketetal: myalgias[ y ];  joint swelling[  ];  joint erythema[  ];  joint pain[y  ];  back pain[ y ];  Heme/Lymph: bruising[  ];  bleeding[  ];  anemia[  ];  Neuro: TIA[  ];  headaches[  ];  stroke[  ];  vertigo[  ];  seizures[  ];   paresthesias[  ];  difficulty walking[ y ];  Psych:depression[y  ]; anxiety[  ];  Endocrine: diabetes[y  ];  thyroid dysfunction[  ];   Other:  Past Medical History  Diagnosis Date  . Cancer 1982    s/p mastectomy and XRT  . Diabetes mellitus     type 2  . Hyperlipidemia   . Mitral insufficiency 2003    s/p annuloplasty   . Coronary artery disease 2003    s/p  CABG; S/P PTCA /STENT 11/2007  . DDD (degenerative disc disease), cervical   . 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 Cervical cancer   . Esophageal stricture   . Peripheral neuropathy      (Not in a hospital admission)     Infusions:   No Known Allergies  History   Social History  . Marital Status: Married    Spouse Name: N/A    Number of Children: N/A  . Years of Education: N/A   Occupational History  . Not on file.   Social History Main Topics  . Smoking status: Never Smoker   . Smokeless tobacco: Not on file  . Alcohol Use: No  . Drug Use: No  . Sexual Activity: Not on file   Other Topics Concern  . 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 Heart disease Mother   . Mental illness Mother     DEMNTIA    PHYSICAL EXAM: Filed Vitals:   07/02/2013 0330  BP: 113/57  Pulse: 95  Temp:   Resp: 14     Intake/Output Summary (Last 24 hours) at 06/29/2013 0325 Last data filed at 06/27/2013 0250  Gross per 24 hour  Intake      0 ml  Output      8 ml  Net     -8 ml    Patient appears weak but in NAD. Lying flat in bed HEENT: Normocephalic, atraumatic. EOMI, PERRLA.  Neck: JVP is 8-9 No bruits.  Lungs: clear to auscultation. No rales no wheezes.  Heart: Regular rate and rhythm. Normal S1, S2. No S3. Gr II/VI systolic murmur at apex. 2/6 AI. +s3 Abdomen: Supple, nontender. Normal bowel sounds. No masses. No hepatomegaly.  Extremities: Good distal pulses throughout. Tr lower extremity edema.  Musculoskeletal :moving all extremities.  Neuro: alert and oriented x3. CN II-XII grossly intact.   EKG Sinus tachycardia 101 bpm. Occasional RBBB. 1AVB LAFB RVH.    Results for orders placed during the hospital encounter of 06/15/2013 (from the past 24 hour(s))  CBC WITH DIFFERENTIAL     Status: Abnormal   Collection Time    13-Jun-2013 12:52 AM      Result Value Range   WBC 8.6  4.0 - 10.5 K/uL   RBC 3.92  3.87 - 5.11 MIL/uL   Hemoglobin 12.2  12.0 - 15.0 g/dL   HCT 95.6  21.3 - 08.6 %   MCV 91.8  78.0 - 100.0 fL   MCH 31.1  26.0 - 34.0 pg   MCHC 33.9  30.0 - 36.0 g/dL   RDW 57.8 (*) 46.9 - 62.9 %   Platelets 283  150 - 400 K/uL   Neutrophils Relative % 79 (*) 43 - 77 %   Neutro Abs 6.8  1.7 - 7.7 K/uL   Lymphocytes Relative 12  12 - 46 %   Lymphs Abs 1.0  0.7 - 4.0 K/uL   Monocytes Relative 8  3 - 12 %   Monocytes Absolute 0.7  0.1 - 1.0 K/uL   Eosinophils Relative 1  0 - 5 %   Eosinophils Absolute 0.1  0.0 - 0.7 K/uL   Basophils Relative 1  0 - 1 %   Basophils Absolute 0.1  0.0 - 0.1 K/uL  GLUCOSE, CAPILLARY     Status: Abnormal     Collection Time    06/13/13  1:47 AM      Result Value Range   Glucose-Capillary 171 (*) 70 - 99 mg/dL  PRO B NATRIURETIC PEPTIDE     Status: Abnormal   Collection Time    06-13-2013  1:48 AM      Result Value Range   Pro B Natriuretic peptide (BNP) 2509.0 (*) 0 - 125 pg/mL  POCT I-STAT TROPONIN I     Status: None   Collection Time    06/13/13  1:55 AM      Result Value Range   Troponin i, poc 0.06  0.00 - 0.08 ng/mL   Comment 3           POCT I-STAT, CHEM 8     Status: Abnormal   Collection Time    13-Jun-2013  1:56 AM      Result Value Range   Sodium 127 (*) 135 - 145 mEq/L   Potassium 4.0  3.5 - 5.1 mEq/L   Chloride 92 (*) 96 - 112 mEq/L   BUN 18  6 - 23 mg/dL   Creatinine, Ser 5.28 (*) 0.50 - 1.10 mg/dL   Glucose, Bld 413 (*) 70 - 99 mg/dL   Calcium, Ion 2.44 (*) 1.13 - 1.30 mmol/L   TCO2 26  0 - 100 mmol/L   Hemoglobin 12.9  12.0 - 15.0 g/dL   HCT 01.0  27.2 - 53.6 %  CARBOXYHEMOGLOBIN     Status: Abnormal   Collection Time    2013/06/13  3:09 AM      Result Value Range   Total hemoglobin 11.5 (*) 12.0 - 16.0 g/dL   O2 Saturation 64.4     Carboxyhemoglobin 1.9 (*) 0.5 - 1.5 %   Methemoglobin 0.6  0.0 - 1.5 %  POCT I-STAT 3, BLOOD GAS (G3P V)     Status: Abnormal   Collection Time    06/13/13  3:10 AM      Result Value Range   pH, Ven 7.412 (*) 7.250 - 7.300   pCO2, Ven 39.9 (*) 45.0 - 50.0 mmHg   pO2, Ven 34.0  30.0 - 45.0 mmHg   Bicarbonate 25.4 (*) 20.0 - 24.0  mEq/L   TCO2 27  0 - 100 mmol/L   O2 Saturation 66.0     Acid-Base Excess 1.0  0.0 - 2.0 mmol/L   Sample type VENOUS     Comment NOTIFIED PHYSICIAN     Dg Chest Port 1 View  2013-06-12   *RADIOLOGY REPORT*  Clinical Data: Chest pain  PORTABLE CHEST - 1 VIEW  Comparison: Prior radiograph from 05/19/2013  Findings: Median sternotomy wires of underlying surgical clips are again noted.  Cardiac silhouette is unchanged.  The lungs are normally inflated.  No focal infiltrate to suggest acute infectious  pneumonitis is identified.  There is no pulmonary edema or pleural effusion.  No pneumothorax.  Cholecystectomy clips again noted.  No acute osseous abnormality.  IMPRESSION: Stable appearance of the chest with no active cardiopulmonary process identified.   Original Report Authenticated By: Rise Mu, M.D.     ASSESSMENT: 1. Chest pain 2. Severe deconditioning/failure to thrive 3. 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 CAD s/p CABG - stable by recent cath 5.  Mitral regurg/aortic regurg (moderate) 6. DM2 7. Chronic pain  PLAN/DISCUSSION:  From a cardiac perspective she is relatively stable with much improved cardiac output on milrinone. She may have very mild volume overload. Given recent cath results, I doubt CP is unstable angina. Main issue now is her severe debility and caregiver fatigue. She will need admission for placement either to [facility] (only SNF that takes milrinone) or CIR. If unable to rehab significantly will likely need Hospice for her HF and deconditioning. Given her chronic pain, DM2 and need for placement we will ask the Medicine team to admit her and we will assist with her HF management. Continue milrinone at 0.25 mcg/kg/min. Continue diuretics. Can give IV lasix as needed.   Daniel Bensimhon,MD 4:51 AM

## 2013-06-06 NOTE — ED Notes (Signed)
Family at bedside. 

## 2013-06-06 NOTE — H&P (Addendum)
[facility] History and Physical  Jacqueline Lamb ZOX:[id number] DOB: 10-20-1945 DOA: 06/07/2013  Referring physician: ED PCP: Illene Regulus, MD  Chief Complaint: Chest pain  HPI: Jacqueline Lamb is a 67 y.o. female who presents with chest pain.  Pain is located in her L chest with radiation to her L arm, she states it has been going on for the past 1 hour associated with shortness of breath.  This is occuring in the context of known CAD, end stage CHF with complete occlusion of the patients LAD just distal to her RIMA CABG as demonstrated on cardiac cath on 05/23/13 at [facility].  Unfortunately due to generalized deconditioning and poor rehab potential the patient is not felt to be a candidate for BI-VAD or transplant at this time.  After talking to patient is actually in the ED it seems because her husband is unable to care for her at this point in time and so she requires SNF placement and will be given a chance to undergo rehab.  Review of Systems: 12 systems reviewed and otherwise negative.  Past Medical History  Diagnosis Date  . Cancer 1982    s/p mastectomy and XRT  . Diabetes mellitus     type 2  . Hyperlipidemia   . Mitral insufficiency 2003    s/p annuloplasty   . Coronary artery disease 2003    s/p  CABG; S/P PTCA /STENT 11/2007  . DDD (degenerative disc disease), cervical   . 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 Cervical cancer   . Esophageal stricture   . Peripheral neuropathy    Past Surgical History  Procedure Laterality Date  . Appendectomy    . Coronary artery bypass graft    . Mv annulosplasty    . Cholecystectomy    . Abdominal hysterectomy    . Breast surgery  1980    LEFT BREAST  . Esophagogastroduodenoscopy  09/22/2000  . Spine surgery  2008    C-SPINE DISKECTOMY AND FUSION C6-7   Social History:  reports that she has never smoked. She does not have any smokeless tobacco history on file. She reports that she does not drink alcohol or use illicit  drugs.   No Known Allergies  Family History  Problem Relation Age of Onset  . Heart disease Mother   . Mental illness Mother     DEMNTIA    Prior to Admission medications   Medication Sig Start Date End Date Taking? Authorizing Provider  amiodarone (PACERONE) 200 MG tablet Take 200 mg by mouth daily.   Yes Historical Provider, MD  aspirin 81 MG chewable tablet Chew 81 mg by mouth daily.   Yes Historical Provider, MD  Carbidopa-Levodopa ER (SINEMET CR) 25-100 MG tablet controlled release Take 1 tablet by mouth daily.  06/02/13 06/02/14 Yes Historical Provider, MD  ciprofloxacin (CIPRO) 250 MG tablet Take 250 mg by mouth 2 (two) times daily.   Yes Historical Provider, MD  clotrimazole (LOTRIMIN) 1 % external solution Apply 1 application topically 2 (two) times daily.   Yes Historical Provider, MD  docusate sodium (COLACE) 100 MG capsule Take 100 mg by mouth 2 (two) times daily as needed for constipation.    Yes Historical Provider, MD  ferrous sulfate 325 (65 FE) MG tablet Take 325 mg by mouth daily with breakfast.   Yes Historical Provider, MD  insulin glargine (LANTUS) 100 UNIT/ML injection Inject 8 Units into the skin at bedtime.   Yes Historical Provider, MD  lisinopril (PRINIVIL,ZESTRIL) 5 MG tablet Take 5 mg by mouth daily.   Yes Historical Provider, MD  Melatonin 3 MG TABS Take 1 tablet by mouth at bedtime.   Yes Historical Provider, MD  Milrinone in Dextrose 40 MG/200ML SOLN Inject into the vein continuous. 18.075 mcg/min   Yes Historical Provider, MD  mirtazapine (REMERON) 7.5 MG tablet Take 7.5 mg by mouth at bedtime.   Yes Historical Provider, MD  omeprazole (PRILOSEC OTC) 20 MG tablet Take 20 mg by mouth 2 (two) times daily.    Yes Historical Provider, MD  oxyCODONE-acetaminophen (PERCOCET/ROXICET) 5-325 MG per tablet Take 1 tablet by mouth every 6 (six) hours as needed for pain.    Yes Historical Provider, MD  senna-docusate (SENOKOT-S) 8.6-50 MG per tablet Take 2 tablets by  mouth 2 (two) times daily.   Yes Historical Provider, MD  simethicone (MYLICON) 80 MG chewable tablet Chew 80 mg by mouth every 6 (six) hours as needed for flatulence.   Yes Historical Provider, MD  spironolactone (ALDACTONE) 25 MG tablet Take 12.5 mg by mouth daily.   Yes Historical Provider, MD  torsemide (DEMADEX) 20 MG tablet Take 40 mg by mouth daily.   Yes Historical Provider, MD  traMADol (ULTRAM) 50 MG tablet Take 25 mg by mouth every 6 (six) hours as needed for pain.   Yes Historical Provider, MD  vitamin B-12 (CYANOCOBALAMIN) 1000 MCG tablet Take 1,000 mcg by mouth daily.   Yes Historical Provider, MD   Physical Exam: Filed Vitals:   07/01/2013 0330  BP: 113/57  Pulse: 95  Temp:   Resp: 14    General:  NAD, resting comfortably in bed Eyes: PEERLA EOMI ENT: mucous membranes moist Neck: supple w/o JVD Cardiovascular: RRR w/o MRG Respiratory: CTA B Abdomen: soft, nt, nd, bs+ Skin: no rash nor lesion Musculoskeletal: MAE, full ROM all 4 extremities Psychiatric: normal tone and affect Neurologic: AAOx3, grossly non-focal  Labs on Admission:  Basic Metabolic Panel:  Recent Labs Lab 06/15/2013 0156  NA 127*  K 4.0  CL 92*  GLUCOSE 159*  BUN 18  CREATININE 1.60*   Liver Function Tests: No results found for this basename: AST, ALT, ALKPHOS, BILITOT, PROT, ALBUMIN,  in the last 168 hours No results found for this basename: LIPASE, AMYLASE,  in the last 168 hours No results found for this basename: AMMONIA,  in the last 168 hours CBC:  Recent Labs Lab 06/07/2013 0052 06/20/2013 0156  WBC 8.6  --   NEUTROABS 6.8  --   HGB 12.2 12.9  HCT 36.0 38.0  MCV 91.8  --   PLT 283  --    Cardiac Enzymes: No results found for this basename: CKTOTAL, CKMB, CKMBINDEX, TROPONINI,  in the last 168 hours  BNP (last 3 results)  Recent Labs  05/10/13 1336 05/15/13 1425 06/05/2013 0148  PROBNP 1262.0* 1562.0* 2509.0*   CBG:  Recent Labs Lab 06/22/2013 0147  GLUCAP 171*     Radiological Exams on Admission: Dg Chest Port 1 View  06/19/2013   *RADIOLOGY REPORT*  Clinical Data: Chest pain  PORTABLE CHEST - 1 VIEW  Comparison: Prior radiograph from 05/19/2013  Findings: Median sternotomy wires of underlying surgical clips are again noted.  Cardiac silhouette is unchanged.  The lungs are normally inflated.  No focal infiltrate to suggest acute infectious pneumonitis is identified.  There is no pulmonary edema or pleural effusion.  No pneumothorax.  Cholecystectomy clips again noted.  No acute osseous abnormality.  IMPRESSION: Stable appearance  of the chest with no active cardiopulmonary process identified.   Original Report Authenticated By: Rise Mu, M.D.    EKG: Independently reviewed.  Assessment/Plan Active Problems:   Chronic systolic heart failure   Chest pain   1. Chest pain - cardiology has seen the patient, does not feel that this is likely to be ACS given that the patients vessel occlusion is known and chronic, has recommended symptomatic treatment at this time (please see their note for full details). 2. Chronic systolic CHF - essentially end stage CHF at this point, on milrinone gtt managed by cards, admitting patient to medicine for SNF placement at this time.  But does not appear to be in acute exacerbation.    Code Status: Full Code (must indicate code status--if unknown or must be presumed, indicate so) Family Communication: No family in room (indicate person spoken with, if applicable, with phone number if by telephone) Disposition Plan: Admit to obs(indicate anticipated LOS)  Time spent: 50 min  Mikenzi Raysor M. [facility] Pager [phone]  If 7PM-7AM, please contact night-coverage [email or web address] Password [facility] 06/12/2013, 5:21 AM

## 2013-06-06 NOTE — Progress Notes (Signed)
Utilization Review Completed.   Littleton Haub, RN, BSN Nurse Case Manager  [phone]  

## 2013-06-06 NOTE — Progress Notes (Signed)
Patient's husband request that the staff in and out cath patient, because he does at home, MD made aware and orders given to bladder scan and in and out cath if patient has more than 300cc of urine; will continue to monitor patient. Lorretta Harp RN

## 2013-06-06 NOTE — Progress Notes (Addendum)
Subjective: Jacqueline Lamb is well known to me. She has progressive biventriuclar failure and increasing weakness. She has been seen by Dr. Tenny Craw, was seen ast [facility] for Cath and recommendations. She needs heart transplant but is too debilitated to be a candidate. She is now admitted with mild CHF and need for SNF/CIR vs Hospice. Cardiology is managing milrinone and CHF.   Jacqueline Lamb is in no acute distress but does complain of neck and shoulder pain. She has known DDD cervical spine but is not an operative candidate.   Discussed End of LIfe care: she understands her chance of successful resuscitation is low and that the aftermath would be grim. She expresses a wish for no heroics, no resuscitation.   Objective: Lab:  Recent Labs  2013/06/29 0052 06-29-2013 0156  WBC 8.6  --   NEUTROABS 6.8  --   HGB 12.2 12.9  HCT 36.0 38.0  MCV 91.8  --   PLT 283  --     Recent Labs  06-29-13 0156  NA 127*  K 4.0  CL 92*  GLUCOSE 159*  BUN 18  CREATININE 1.60*   BNP 2509  Trop 0.06  Imaging: 06-29-2013: IMPRESSION:  Stable appearance of the chest with no active cardiopulmonary  process identified.  Scheduled Meds: . amiodarone  200 mg Oral Daily  . aspirin  81 mg Oral Daily  . Carbidopa-Levodopa ER  1 tablet Oral Daily  . ciprofloxacin  250 mg Oral BID  . ferrous sulfate  325 mg Oral Q breakfast  . heparin  5,000 Units Subcutaneous Q8H  . insulin glargine  8 Units Subcutaneous QHS  . lisinopril  5 mg Oral Daily  . mirtazapine  7.5 mg Oral QHS  . pantoprazole  40 mg Oral BID  . senna-docusate  2 tablet Oral BID  . sodium chloride  3 mL Intravenous Q12H  . spironolactone  12.5 mg Oral Daily  . torsemide  40 mg Oral Daily  . vitamin B-12  1,000 mcg Oral Daily   Continuous Infusions:  PRN Meds:.docusate sodium, oxyCODONE-acetaminophen, simethicone, traMADol   Physical Exam: Filed Vitals:   2013/06/29 0639  BP: 118/52  Pulse: 83  Temp: 98.4 F (36.9 C)  Resp: 20   Gen'l-  Ill appearing white woman in no acute distress HEENT- normal Cor - 2+ radial pulse, quiet precordium, RRR. II/VI systolic murmu Pul - unlabored respirations Neuro - non-focal     Assessment/Plan: 1. Cardiac - multi-vessel disease with occlusions as noted. Her pain does not seem anginal. She is too weak to be a candidate for heart transplant at this time. Heart failure is mild and she is doing better with milrione. Na 127  Plan Continue present therapy under the direction of cardiology  For SNF, doubt she is strong enough for CIR  Fluid restrict to 1.5 l daily  2. DM - on home regimen of basal insulin. Will need sliding scale.  Plan  ss orders  Low carb diet  3. Code status - discussed with patient:  Plan DNR/DNI - limited code  Dispo - SNF   Illene Regulus [place] IM (o) [phone]; (c) [phone] Call-grp - Patsi Sears IM  Tele: [phone]  Jun 29, 2013, 7:47 AM

## 2013-06-06 NOTE — Progress Notes (Signed)
Patient BP was 70/29. Notified cardiologist per patients attending physician.  Orders given to continue to monitor patient and notify physician if patients BP decreases any lower. Patient asymptomatic and does not have any complaints except for chronic pain in neck. Will continue to monitor. Patient. Stanton Kidney R

## 2013-06-06 NOTE — ED Notes (Signed)
MD at bedside. 

## 2013-06-06 NOTE — Progress Notes (Signed)
Patient's BP 69/36, patient is still asymptomatic; Cardiologist notified and orders given to transfer patient to stepdown and start dopamine drip; will continue to monitor patient. Lorretta Harp RN

## 2013-06-07 DIAGNOSIS — E119 Type 2 diabetes mellitus without complications: Secondary | ICD-10-CM

## 2013-06-07 DIAGNOSIS — R570 Cardiogenic shock: Secondary | ICD-10-CM

## 2013-06-07 DIAGNOSIS — E871 Hypo-osmolality and hyponatremia: Secondary | ICD-10-CM

## 2013-06-07 LAB — BASIC METABOLIC PANEL
Calcium: 9.2 mg/dL (ref 8.4–10.5)
GFR calc Af Amer: 66 mL/min — ABNORMAL LOW (ref >90)
GFR calc non Af Amer: 57 mL/min — ABNORMAL LOW (ref >90)
Glucose, Bld: 171 mg/dL — ABNORMAL HIGH (ref 70–99)
Potassium: 4.3 mEq/L (ref 3.5–5.1)
Sodium: 133 mEq/L — ABNORMAL LOW (ref 135–145)

## 2013-06-07 LAB — PRO B NATRIURETIC PEPTIDE: Pro B Natriuretic peptide (BNP): 4304 pg/mL — ABNORMAL HIGH (ref 0–125)

## 2013-06-07 MED ORDER — SODIUM CHLORIDE 0.9 % IV SOLN
2.0000 mg/h | INTRAVENOUS | Status: DC
  Administered 2013-06-07: 2 mg/h via INTRAVENOUS
  Administered 2013-06-07: 20 mg/h via INTRAVENOUS
  Administered 2013-06-08: 25 mg/h via INTRAVENOUS
  Filled 2013-06-07 (×4): qty 10

## 2013-06-07 MED ORDER — DIPHENHYDRAMINE HCL 50 MG/ML IJ SOLN
25.0000 mg | Freq: Four times a day (QID) | INTRAMUSCULAR | Status: DC | PRN
  Administered 2013-06-07: 25 mg via INTRAVENOUS

## 2013-06-07 MED ORDER — DIPHENHYDRAMINE HCL 50 MG/ML IJ SOLN
INTRAMUSCULAR | Status: AC
  Administered 2013-06-07: 25 mg via INTRAVENOUS
  Filled 2013-06-07: qty 1

## 2013-06-07 MED ORDER — INSULIN ASPART 100 UNIT/ML ~~LOC~~ SOLN: 0.0000 [IU] | Freq: Three times a day (TID) | SUBCUTANEOUS | Status: DC

## 2013-06-07 MED ORDER — MORPHINE SULFATE 2 MG/ML IJ SOLN: 2.0000 mg | INTRAMUSCULAR | Status: DC | PRN

## 2013-06-07 MED ORDER — INSULIN ASPART 100 UNIT/ML ~~LOC~~ SOLN: 3.0000 [IU] | Freq: Three times a day (TID) | SUBCUTANEOUS | Status: DC

## 2013-06-07 NOTE — Progress Notes (Addendum)
In and out cath done. Pt unable to void in the bedpan and bedside commode. HR went up to the 150's.

## 2013-06-07 NOTE — Progress Notes (Signed)
Patient transferred to unit 2900 overnight. Report given to Rozetta Nunnery, [place] and contacted Steward Drone- RN Liason at [facility] as well as Milagros Reap- RN Liason- [facility]. Bed is available at the SNF and we are currently seeking insurance auth pending stabilization of patient. This  CSW will sign off but I am available to assist in any manner.  Lorri Frederick. [place]  [phone]

## 2013-06-07 NOTE — Clinical Social Work Psychosocial (Addendum)
    Clinical Social Work Department BRIEF PSYCHOSOCIAL ASSESSMENT 06/07/2013  Patient:  Jacqueline Lamb, Jacqueline Lamb     Account Number:  [id number]     Admit date:  06/25/2013  Clinical Social Worker:  Tiburcio Pea  Date/Time:  06/12/2013 02:45 PM  Referred by:  Physician  Date Referred:  07/01/2013 Referred for  SNF Placement   Other Referral:   Interview type:  Other - See comment Other interview type:   Patient and husband    PSYCHOSOCIAL DATA Living Status:  HUSBAND Admitted from facility:   Level of care:   Primary support name:   Primary support relationship to patient:  SPOUSE Degree of support available:   Very strong support    CURRENT CONCERNS Current Concerns  Post-Acute Placement   Other Concerns:   Patient is on home Milrinone    SOCIAL WORK ASSESSMENT / PLAN 67 year old female who lives at home with her husband. She was released several days ago from [facility] where she had been worked up for SNF placement.  Prior to d/c- patient and husband decided to go home with home Milrinone and Home Health.  This was not a sucessful process and patient was sent to [facility] with complaints of chest pain.  Patient's husband states that services did not work out at home due to the Labor Day weekend.  He now states that he can no longer manage her care at home and requests SNF placement. Patient has been very distraught over this and not wanting to seek SNF; she does agree that her husband cannot manage her care at home.  She has Joette Catching and the only facility in network that can provide care with Milrinone is [facility] [place]. This has also upset patient and husband as they live in [place]. CSW received call from [facility], Pension scheme manager for [employer] [place] [phone].  She stated that they are full prepared to accept patient when medically stable.  CSW provided support to patient and husband today and discussed above. Request for insurance  authorization has also been initiated and information sent to [facility] for review.  FL2 on chart for MD's signature.   Assessment/plan status:  Psychosocial Support/Ongoing Assessment of Needs Other assessment/ plan:   Information/referral to community resources:   SNF deferred due to insurance network issues. Patient and husband are aware of in network facility option.  Clinical information sent to Joyce Copa- for review  [facility]- RN Liason - [facility] is invovled for assistance and support    PATIENT'S/FAMILY'S RESPONSE TO PLAN OF CARE: Patient is alert but somewhat lethargic today. She is aware of plan for SNF placement but has been very upset about this. She is able to verbally respond understanding of husband's inability to care for her at home at this time but remains distraught.  Her husband is also upset and verbalizes feelings of guilt regarding need for placement. This is further exacerbated by fact that she will require placement in [place] which will make things harder for him to get to be with her.  CSW provided support and encouragement; will coordinate with Joette Catching Liason and facility to arrange d/c when medically stable. Lorri Frederick. Shelonda Saxe, LCSWA  [phone]

## 2013-06-07 NOTE — Progress Notes (Signed)
Subjective: Overnight Jacqueline Lamb was unable to maintain an adequate BP and was transferred to CCU for dopamine drip for pressure support. Per RN report she has been unable to wean off dopamine - BP will drop. In addition, with any exertion her heart rate will go to the 150's. Lastly, she has been able to sense when her bladder is full but she has been unable to void and I/O catherization has been required.   Jacqueline Lamb is awake and in no distress - no pain. She denies SOB.  Objective: Lab:  Recent Labs  07/03/2013 0052 06/21/2013 0156  WBC 8.6  --   NEUTROABS 6.8  --   HGB 12.2 12.9  HCT 36.0 38.0  MCV 91.8  --   PLT 283  --     Recent Labs  07/01/2013 0156 06/07/13 0500  NA 127* 133*  K 4.0 4.3  CL 92* 93*  GLUCOSE 159* 171*  BUN 18 19  CREATININE 1.60* 1.01  CALCIUM  --  9.2    Imaging:  Scheduled Meds: . amiodarone  200 mg Oral Daily  . aspirin  81 mg Oral Daily  . Carbidopa-Levodopa ER  1 tablet Oral Daily  . ciprofloxacin  250 mg Oral BID  . ferrous sulfate  325 mg Oral Q breakfast  . heparin  5,000 Units Subcutaneous Q8H  . insulin aspart  0-15 Units Subcutaneous TID WC  . insulin aspart  3 Units Subcutaneous TID WC  . insulin glargine  8 Units Subcutaneous QHS  . lisinopril  5 mg Oral Daily  . mirtazapine  7.5 mg Oral QHS  . pantoprazole  40 mg Oral BID  . senna-docusate  2 tablet Oral BID  . sodium chloride  3 mL Intravenous Q12H  . spironolactone  12.5 mg Oral Daily  . torsemide  40 mg Oral Daily  . vitamin B-12  1,000 mcg Oral Daily   Continuous Infusions: . sodium chloride 5 mL/hr at 06/23/2013 2100  . sodium chloride 5 mL/hr at 06/16/2013 2111  . DOPamine 6 mcg/kg/min (06/07/13 0300)  . milrinone 0.25 mcg/kg/min (06/07/13 0441)   PRN Meds:.docusate sodium, oxyCODONE-acetaminophen, simethicone, sodium chloride, traMADol   Physical Exam: Filed Vitals:   06/07/13 0400  BP: 124/52  Pulse: 113  Temp: 98.5 F (36.9 C)  Resp: 13     Intake/Output Summary (Last 24 hours) at 06/07/13 0625 Last data filed at 06/07/13 0600  Gross per 24 hour  Intake 633.02 ml  Output   1900 ml  Net -1266.98 ml   Gen'l - WNWD white woman HEENT0- C&S clear Cor- 2+ radial, regular tachycardia, no JVD, precordium is quiet Pulm - no increased WOB, no rales on exam Abd - soft Neuro - MAE, grip in left hand is 4/5.      Assessment/Plan: 1. Cardiac - progressive heart failure in setting of multi-vessel disease now requiring IV infusion milrinone and dopamine to maintain pressure. Discussed situation with patient and separately with her husband - a poor prognosis Plan Cardiac panel  Defer management to cardiology  2. DM -  CBG (last 3)   Recent Labs  06/22/2013 1126 06/07/2013 1621 06/13/2013 2115  GLUCAP 138* 85 165*    Continue sliding scale coverage  3. Hyponatremia - corrected to 133. Creatinine improved to 1.01 Plan Continue fluid restriction  4. GU - can sense when her bladder is full but is unable to spontaneously void and requires I&O cath Plan Foley catheter while in CCU  5. 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 She has lost of sensation left arm but good movement. No further evaluation at this time.   Code status - DNR/DNI. Husband appreciates gravity of the situation and the possibility of progressive heart failure. She is not a candidate for transfer to SNF at this time. He will be contacting out of state family to come to see her.    Illene Regulus [place] IM (o) [phone]; (c) [phone] Call-grp - Patsi Sears IM  Tele: [phone]  06/07/2013, 6:09 AM

## 2013-06-07 NOTE — Progress Notes (Signed)
In and out cath done. 350cc uop.

## 2013-06-07 NOTE — Progress Notes (Signed)
HEART FAILURE ROUNDING NOTE  Jacqueline Lamb is a 67 y/o woman with DM2, breast CA s/p mastectomy and XRT. She also has has a history of CAD and severe MR She is s/p status post CABG x 1 (RIMA to LAD) and mitral valve repair in 2003.  She recently saw Dr. Tenny Craw with worsening DOE and fatigue. Referred to [facility] (at patient's request) for cath.  At [facility] with EF 30% mod MR, AR. Moderate RV dysfunction. LHC/RHC with stable CAD and severe biventricular HF with CI 1.1. Started on milrinone.   Admitted with FTT. Yesterday was hypotensive so transferred to SDU and dopamine started. Remains with SBP in 40s-70s despite low-dose dopamine so I was asked to see. Dopamine now on 7 and BP remains 60-85. She is somnolent but comfortable and awakens and is communicative. Her husband is at her bedside. She denies dyspnea or orthopnea. Foley in place as she was unable to urinate. Occasional back and shoulder pain which can be severe at times.   PHYSICAL EXAM: Filed Vitals:   06/07/13 1100 06/07/13 1115 06/07/13 1130 06/07/13 1203  BP: 53/27 47/23 65/27    Pulse: 100 100 100   Temp:    98.2 F (36.8 C)  TempSrc:    Oral  Resp: 12 14 13    Height:      Weight:      SpO2: 98% 97% 97%      Intake/Output Summary (Last 24 hours) at 06/07/13 1207 Last data filed at 06/07/13 1100  Gross per 24 hour  Intake 494.86 ml  Output   1900 ml  Net -1405.14 ml   Somnolent Lying flat in bed HEENT: Normocephalic, atraumatic. EOMI, PERRLA.  Neck: JVP is 7 Lungs: clear to auscultation. No rales no wheezes.  Heart: Regular rate and rhythm. Normal S1, S2. No S3. Gr II/VI systolic murmur at apex. 2/6 AI. +s3 Abdomen: Supple, nontender. Normal bowel sounds. No masses. No hepatomegaly.  Extremities: Good distal pulses throughout. No lower extremity edema.  Musculoskeletal :moving all extremities.  Neuro: alert and oriented x3. CN II-XII grossly intact.   EKG Sinus tachycardia 101 bpm. Occasional RBBB. 1AVB LAFB RVH.     Results for orders placed during the hospital encounter of 07/01/2013 (from the past 24 hour(s))  GLUCOSE, CAPILLARY     Status: None   Collection Time    06/18/2013  4:21 PM      Result Value Range   Glucose-Capillary 85  70 - 99 mg/dL   Comment 1 Notify RN    MRSA PCR SCREENING     Status: None   Collection Time    06/12/2013  8:38 PM      Result Value Range   MRSA by PCR NEGATIVE  NEGATIVE  GLUCOSE, CAPILLARY     Status: Abnormal   Collection Time    07/03/2013  9:15 PM      Result Value Range   Glucose-Capillary 165 (*) 70 - 99 mg/dL   Comment 1 Notify RN     Comment 2 Documented in Chart    BASIC METABOLIC PANEL     Status: Abnormal   Collection Time    06/07/13  5:00 AM      Result Value Range   Sodium 133 (*) 135 - 145 mEq/L   Potassium 4.3  3.5 - 5.1 mEq/L   Chloride 93 (*) 96 - 112 mEq/L   CO2 26  19 - 32 mEq/L   Glucose, Bld 171 (*) 70 - 99 mg/dL  BUN 19  6 - 23 mg/dL   Creatinine, Ser 4.09  0.50 - 1.10 mg/dL   Calcium 9.2  8.4 - 81.1 mg/dL   GFR calc non Af Amer 57 (*) >90 mL/min   GFR calc Af Amer 66 (*) >90 mL/min  GLUCOSE, CAPILLARY     Status: Abnormal   Collection Time    06/07/13  8:32 AM      Result Value Range   Glucose-Capillary 141 (*) 70 - 99 mg/dL   Dg Chest Port 1 View  06/07/2013   **ADDENDUM** CREATED: 06/07/2013 09:07:59  Right PICC line tip projects at the innominate SVC junction.  These findings were discussed with the patients ICU nurse.  **END ADDENDUM** SIGNED BY: M. Ruel Favors, M.D.  06/05/2013   *RADIOLOGY REPORT*  Clinical Data: Chest pain  PORTABLE CHEST - 1 VIEW  Comparison: Prior radiograph from 05/19/2013  Findings: Median sternotomy wires of underlying surgical clips are again noted.  Cardiac silhouette is unchanged.  The lungs are normally inflated.  No focal infiltrate to suggest acute infectious pneumonitis is identified.  There is no pulmonary edema or pleural effusion.  No pneumothorax.  Cholecystectomy clips again noted.  No acute  osseous abnormality.  IMPRESSION: Stable appearance of the chest with no active cardiopulmonary process identified.   Original Report Authenticated By: Rise Mu, M.D.     ASSESSMENT: 1. Chest pain 2. Severe deconditioning/failure to thrive 3. 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 CAD s/p CABG - stable by recent cath 5.  Mitral regurg/aortic regurg (moderate) 6. DM2 7. Chronic pain 8. Hypotension - shock  PLAN/DISCUSSION:  She has end-stage HF and is actively dying. We are unable to maintain her BP despite dual inotropes. We had a long talk about continuing with aggressive care and titration of inotropes or pursuing comfort care. Both her and her husband want to pursue comfort care. Given her comorbidities, I think this is best, if not, only option. I discussed with Dr. Debby Bud who agrees. We will start morphine gtt. We will not titrate inotropes further and once family arrives and she is adequately sedate can even consider stopping. They are aware that she may not live through the afternoon.    Daniel Bensimhon,MD 12:07 PM

## 2013-06-07 NOTE — Progress Notes (Signed)
PT Cancellation Note  Patient Details Name: Jacqueline Lamb MRN: [id number] DOB: [birth year]   Cancelled Treatment:    Reason Eval/Treat Not Completed: Medical issues which prohibited therapy.  Pt with decreased BP & increased HR with exertion.      Verdell Face, [place] [phone] 06/07/2013

## 2013-06-07 NOTE — Progress Notes (Signed)
OT Cancellation Note  Patient Details Name: CARLI LEFEVERS MRN: [id number] DOB: 1946/07/06   Cancelled Treatment:    Reason Eval/Treat Not Completed: Medical issues which prohibited therapy - Low BP with increased HR.  Will reattempt as medically indicated   Boykin Reaper [phone] 06/07/2013, 10:40 AM

## 2013-06-07 NOTE — Clinical Social Work Placement (Addendum)
    Clinical Social Work Department CLINICAL SOCIAL WORK PLACEMENT NOTE 06/07/2013  Patient:  Jacqueline Lamb, Jacqueline Lamb  Account Number:  [id number] Admit date:  06/05/2013  Clinical Social Worker:  Lupita Leash Darianny Momon, LCSWA  Date/time:  06/23/2013 03:00 PM  Clinical Social Work is seeking post-discharge placement for this patient at the following level of care:   SKILLED NURSING   (*CSW will update this form in Epic as items are completed)     Patient/family provided with [facility] Department of Clinical Social Work's list of facilities offering this level of care within the geographic area requested by the patient (or if unable, by the patient's family).    Patient/family informed of their freedom to choose among providers that offer the needed level of care, that participate in Medicare, Medicaid or managed care program needed by the patient, have an available bed and are willing to accept the patient.    Patient/family informed of [facility]' ownership interest in [facility], as well as of the fact that they are under no obligation to receive care at this facility.  PASARR submitted to EDS on  PASARR number received from EDS on   FL2 transmitted to all facilities in geographic area requested by pt/family on  06/07/2013 FL2 transmitted to all facilities within larger geographic area on   Patient informed that his/her managed care company has contracts with or will negotiate with  certain facilities, including the following:   Joette Catching- Steward Drone- RN Liason  Patient has existing PASARR  Only SNF in network for Milrinone is [facility] [employer] will not pay out of network facility     Patient/family informed of bed offers received:   Patient chooses bed at  Physician recommends and patient chooses bed at    Patient to be transferred to  on   Patient to be transferred to facility by Ambulance  Sharin Mons)  The following physician request were  entered in Epic:   Additional Comments:

## 2013-06-07 NOTE — Progress Notes (Signed)
Notified Dr. Debby Bud of patients continued low blood pressures. Dopamine order changed to allow for titration. Will continue to assess and monitor.

## 2013-06-07 NOTE — Progress Notes (Signed)
Subjective: Jacqueline Lamb is in no discomfort. Reviewed all notes.  Objective: Lab:  Recent Labs  06/16/2013 0052 07/03/2013 0156  WBC 8.6  --   NEUTROABS 6.8  --   HGB 12.2 12.9  HCT 36.0 38.0  MCV 91.8  --   PLT 283  --     Recent Labs  07/03/2013 0156 06/07/13 0500  NA 127* 133*  K 4.0 4.3  CL 92* 93*  GLUCOSE 159* 171*  BUN 18 19  CREATININE 1.60* 1.01  CALCIUM  --  9.2    Imaging:  Scheduled Meds: . ciprofloxacin  250 mg Oral BID  . sodium chloride  3 mL Intravenous Q12H  . torsemide  40 mg Oral Daily   Continuous Infusions: . sodium chloride 5 mL/hr at 06/10/2013 2100  . sodium chloride 5 mL/hr at 06/23/2013 2111  . DOPamine 7 mcg/kg/min (06/07/13 1123)  . milrinone 0.25 mcg/kg/min (06/07/13 0441)  . morphine 6 mg/hr (06/07/13 1623)   PRN Meds:.diphenhydrAMINE, morphine injection, oxyCODONE-acetaminophen, sodium chloride   Physical Exam: Filed Vitals:   06/07/13 1800  BP: 47/13  Pulse: 124  Temp:   Resp: 10        Assessment/Plan: End of life care - Jacqueline Lamb has end stage heart failure and is actively dying. Spoke with her about her condition and the goal of comfort care. She voices understanding and agreement with the plan of MS infusion for comfort and continuing dopamine until she can have closure with her loved ones. Her dopamine will not be titrated up and if she survives the plan is to wean of heroic life support in AM.   All calls and questions should be directed to me.    Illene Regulus Matthews IM (o) [phone]; (c) [phone] Call-grp - Patsi Sears IM  Tele: [phone]  06/07/2013, 6:19 PM

## 2013-06-08 ENCOUNTER — Ambulatory Visit: Payer: Medicare Other | Admitting: Internal Medicine

## 2013-06-08 DIAGNOSIS — M5030 Other cervical disc degeneration, unspecified cervical region: Secondary | ICD-10-CM

## 2013-06-08 DIAGNOSIS — I6350 Cerebral infarction due to unspecified occlusion or stenosis of unspecified cerebral artery: Secondary | ICD-10-CM

## 2013-06-19 NOTE — Discharge Summary (Signed)
Jacqueline Lamb, Jacqueline Lamb           ACCOUNT NO.:  [id number]  MEDICAL RECORD NO.:  [id number]  LOCATION:                               FACILITY:  [facility]  PHYSICIAN:  Rosalyn Gess. Mamie Hundertmark, MD  DATE OF BIRTH:  [birth year]  DATE OF ADMISSION:  06/05/2013 DATE OF DISCHARGE:  06/16/2013                              DISCHARGE SUMMARY   DEATH SUMMARY:  HISTORY OF PRESENT ILLNESS:  Jacqueline Lamb was a 67 year old woman with a longstanding history of known coronary artery disease, status post RIMA to LAD.  She also had a mitral valve repair in 2003.  The patient did also follow up with diabetes and hypertension.  Her medical care had been intermittent due to insurance reasons.  The patient presented to Dr. Dietrich Pates on May 18, 2013, because of increasing fatigue and dyspnea on exertion.  She was offered immediate hospitalization and cardiac catheterization, but declined and preferred to seek care at [facility].  The patient was admitted to [facility] and underwent cardiac catheterization, May 23, 2013, which revealed occluded LAD distal to the right internal mammary artery graft.  The patient has profound heart failure with an ejection fraction that was less than 30%.  She was not a candidate for a transplant at the time of her admission because of weakness.  The plan was for her to be discharged on milrinone and be admitted to a skilled care facility for strengthening and rehabilitation.    During that hospitalization at [facility] on May 26, 2013, she had a thalamic CVA, diagnosed by a CT scan leaving her with decreased sensation in the left arm, but fairly good function.  The patient presented to [facility] Emergency Department on June 05, 2013, because of increasing weakness and inability to be managed at home because of her condition and was subsequently admitted.  She was found to be in significant biventricular heart failure.  She was seen by Cardiology.  She  was continued on milrinone and admitted to a telemetry bed.  The patient became profoundly hypotensive and required transfer to the Coronary Care unit, and she was started on dopamine as pressor support. By June 07, 2013, the patient remained on dopamine and could not be weaned due to significant hypotension.  She had episodes of tachycardia.  She was seen by Dr. Gala Romney for the Heart Failure Service.  It was felt at that time that she had end-stage heart failure and was actively dying.  For comfort, she was started on a morphine infusion and pressor agents were continued pending the arrival of her family.  The patient's husband had been counseled throughout her hospital stay, in regards to her failing condition.  He was aware of her wishes to be DNR and DNI and supported her care plan as outlined above.    The patient's family did arrive from out of town, and there was an opportunity for closure.  On the evening of June 07, 2013, her milrinone and dopamine was stopped.  She was continued on a morphine infusion.  The patient was seen on the morning of June 08, 2013. She was minimally responsive, but appeared to be comfortable. 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  The patient then quietly expired at 0819 hours.  CAUSE OF DEATH:  End-stage heart disease with biventricular heart failure.  No autopsy was obtained.     Rosalyn Gess Janal Haak, MD   ______________________________ Rosalyn Gess. Sumit Branham, MD    MEN/MEDQ  D:  06/18/2013  T:  06/18/2013  Job:  [id number]

## 2013-07-05 NOTE — Progress Notes (Signed)
Pt asystole on the monitor @0819 .  Auscultated heart and lung sounds for a full minute with Carlton Adam RN.  No sounds heard.  Family at the bedside.  Chaplain offered and declined.  TOD [id number].   Eliane Decree, RN

## 2013-07-05 NOTE — Progress Notes (Signed)
Subjective: Jacqueline Lamb is somnolent/unresponsive to voice. In no apparent distress. Through the night her rate was quite variable as were her respirations. Last BP very low. Spoke with Mr. Hebner and Mrs. Moch's sister.   Objective: Lab:  Recent Labs  06/07/2013 0052 06/16/2013 0156  WBC 8.6  --   NEUTROABS 6.8  --   HGB 12.2 12.9  HCT 36.0 38.0  MCV 91.8  --   PLT 283  --     Recent Labs  06/16/2013 0156 06/07/13 0500  NA 127* 133*  K 4.0 4.3  CL 92* 93*  GLUCOSE 159* 171*  BUN 18 19  CREATININE 1.60* 1.01  CALCIUM  --  9.2    Imaging:  Scheduled Meds:  Continuous Infusions: . sodium chloride 5 mL/hr at 06/05/2013 2100  . morphine 25 mg/hr (2013-06-24 0515)   PRN Meds:.morphine injection, sodium chloride   Physical Exam: Filed Vitals:   06/07/13 2000  BP: 47/20  Pulse: 131  Temp:   Resp: 14   Mild labored breathing, very wet breath sounds Cor - distant heart sounds, RRR Neuro - obtunded.     Assessment/Plan: Palliative care - dopamine and milrinone stopped last PM.  Plan Supportive care with IV morphine  Transfer to a palliative care bed - d/c tele, BP monitoring  Advised Mr. Hazan to take a break at home but he is informed that patients will take the opportunity to leave when their loved ones are on break.   Illene Regulus [place] IM (o) [phone]; (c) [phone] Call-grp - Patsi Sears IM  Tele: [phone]  06-24-2013, 7:30 AM

## 2013-07-05 DEATH — deceased

## 2014-12-22 IMAGING — CR DG CHEST 2V
2 series · 2 of 2 positions shown · non-contrast
Comparison: 06/07/2007

CLINICAL DATA: Altered mental status, emesis

CHEST - 2 VIEW

[w chest lat]
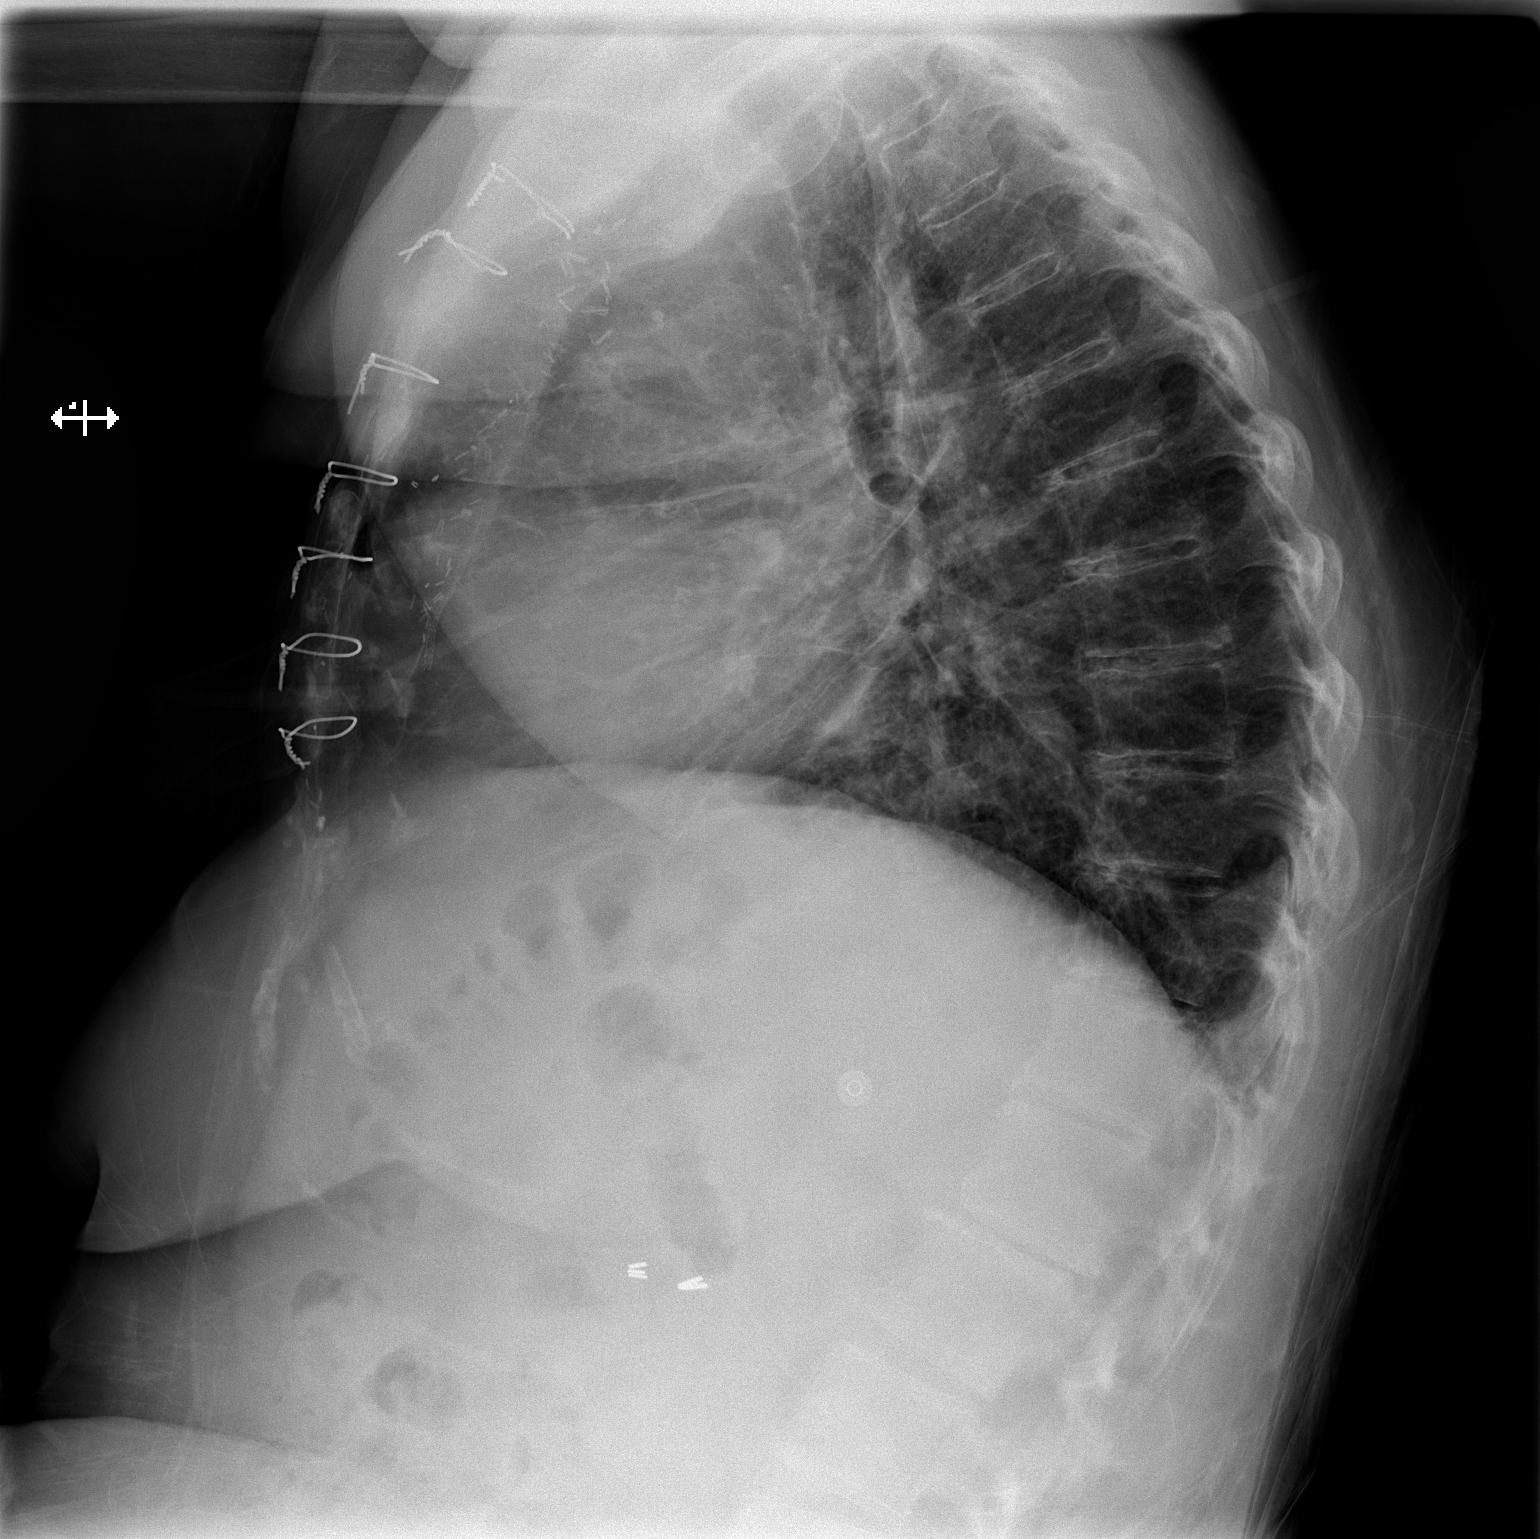

[x chest ap]
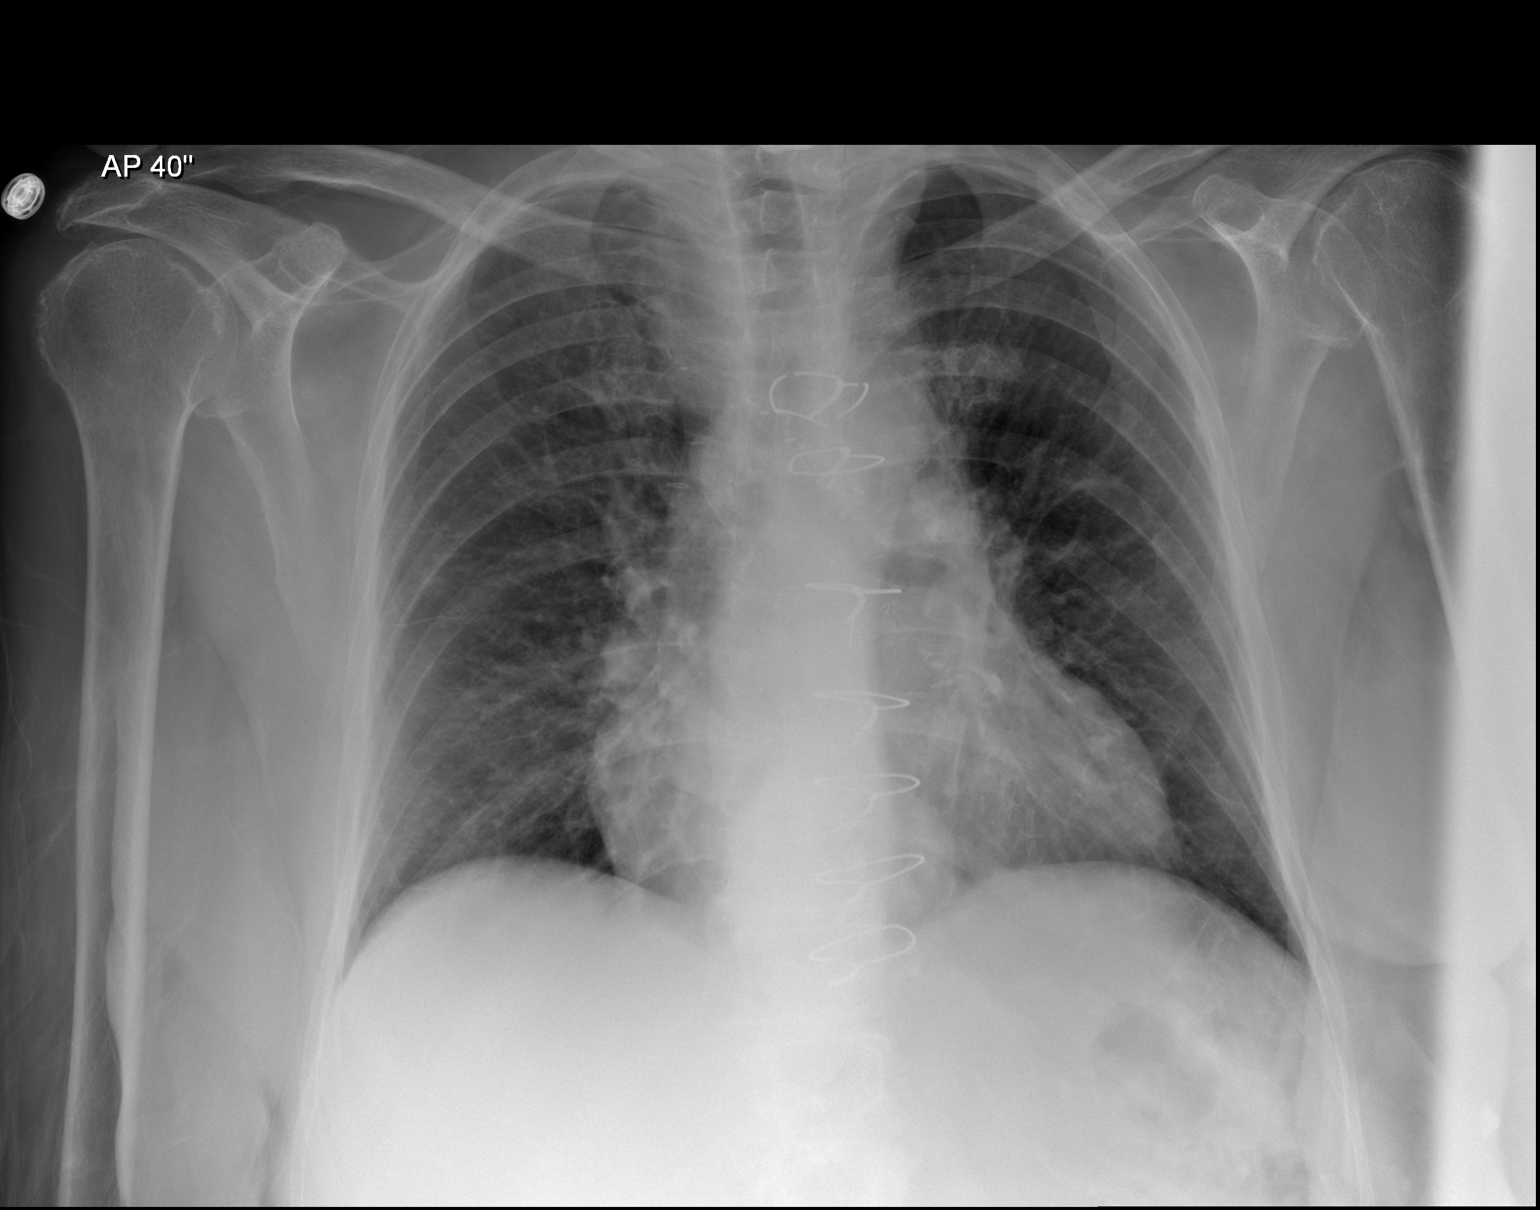

[2 of 2 positions shown; findings below may reference images not displayed]

FINDINGS: Cardiomediastinal silhouette is stable.  Status post
CABG.  No acute infiltrate or pleural effusion.  No pulmonary
edema.  Osteopenia and degenerative changes thoracic spine.
Probable small hiatal hernia.  Metallic fixation plate cervical
spine.
IMPRESSION: No active disease.  Osteopenia and degenerative changes thoracic
spine.  Probable small hiatal hernia.

## 2014-12-22 IMAGING — CT CT HEAD W/O CM
2 series · 16 of 30 positions shown, 18 images · non-contrast
Comparison: None.

CLINICAL DATA: Diffuse weakness

CT HEAD WITHOUT CONTRAST
TECHNIQUE: Contiguous axial images were obtained from the base of
the skull through the vertex without contrast.

[Series 2: head w/o · axial · non-contrast · 0.49mm/px · z∈[+72,+212]mm · 8 of 36 slices shown, 10 images]
[im 4/36  brain]
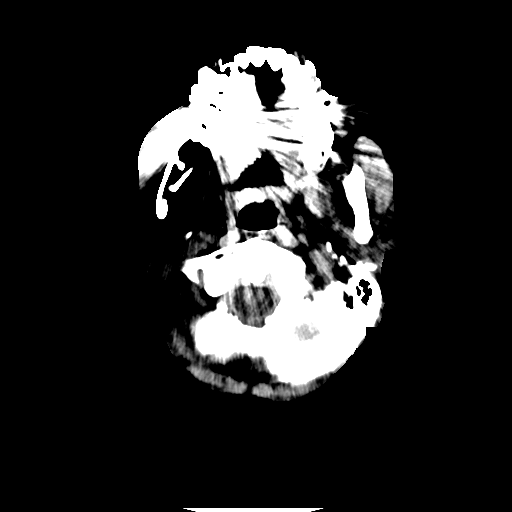
[im 4/36  bone]
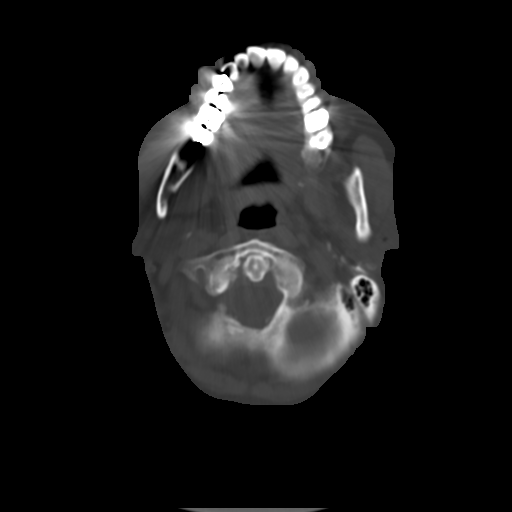
[im 8/36  brain]
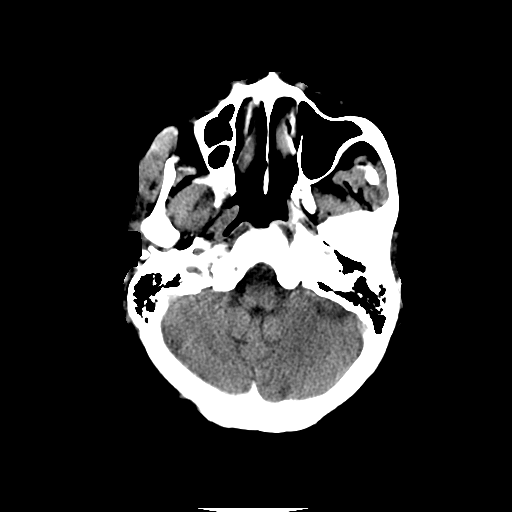
[im 12/36  brain]
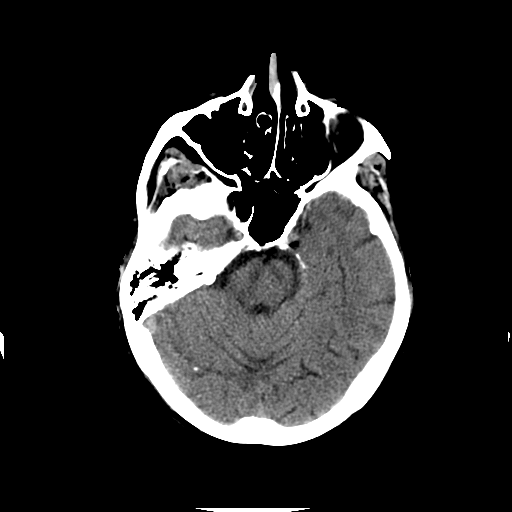
[im 16/36  brain]
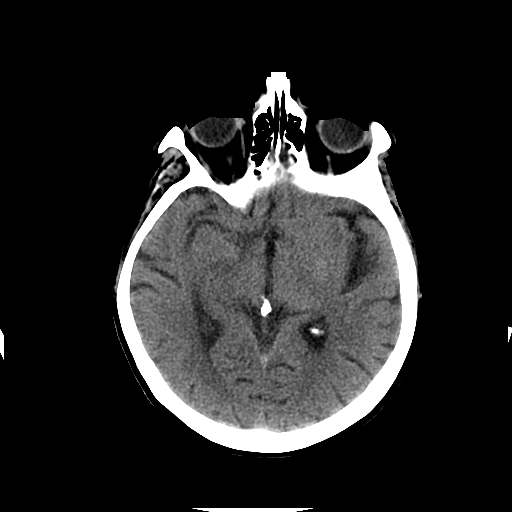
[im 20/36  brain]
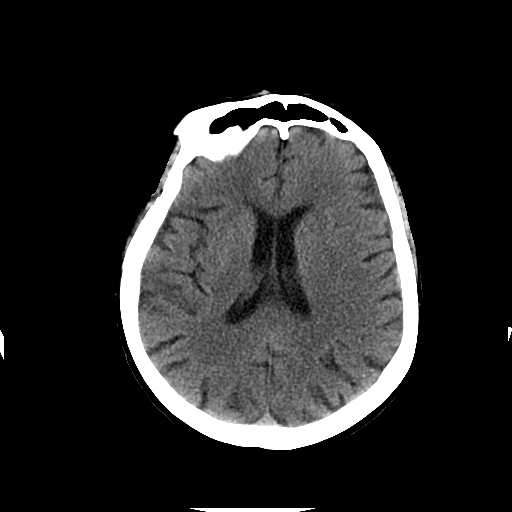
[im 20/36  bone]
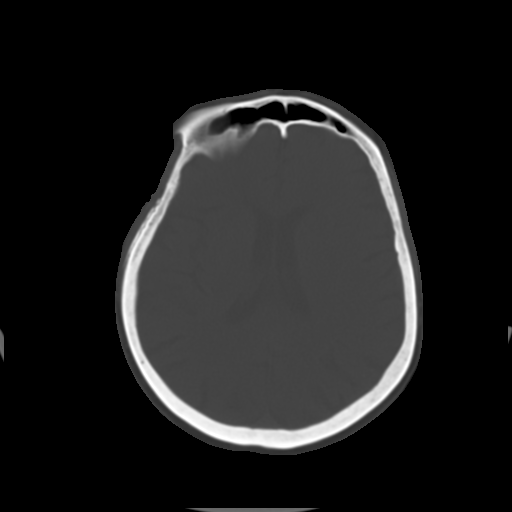
[im 24/36  brain]
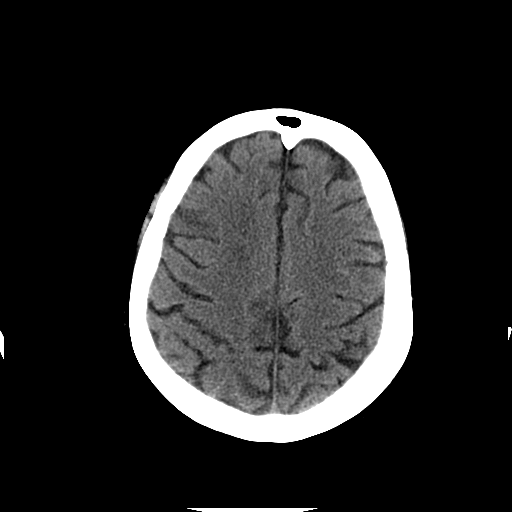
[im 28/36  brain]
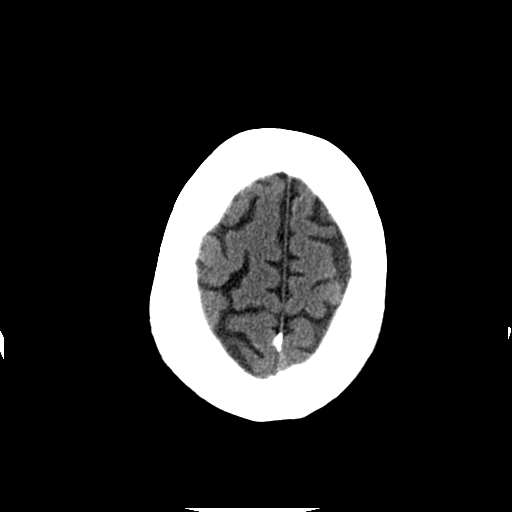
[im 32/36  brain]
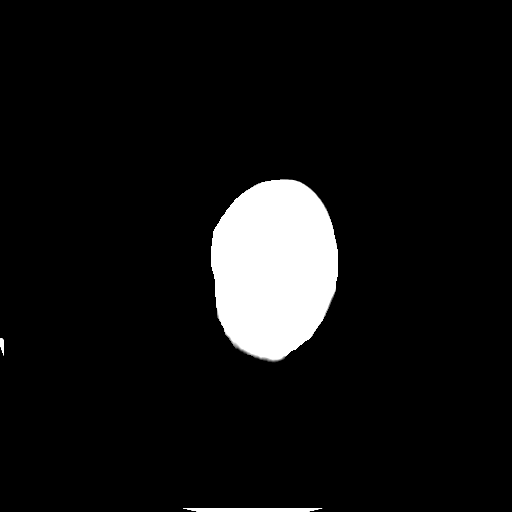

[Series 3: head w/o bone · axial · non-contrast · 0.49mm/px · z∈[+75,+212]mm · 8 of 71 slices shown]
[im 8/71  bone]
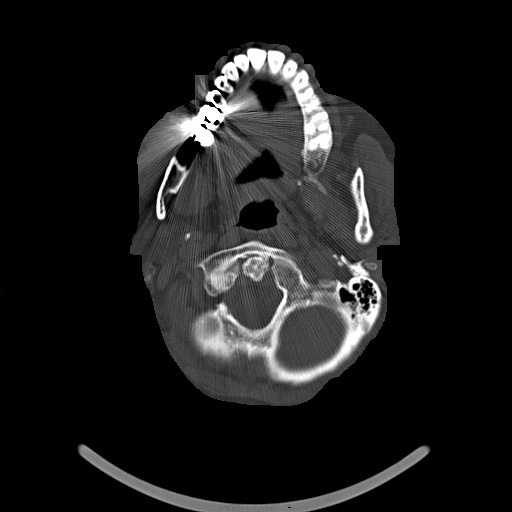
[im 15/71  bone]
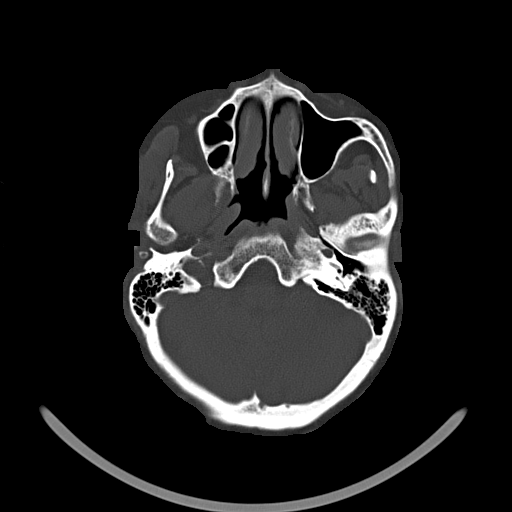
[im 23/71  bone]
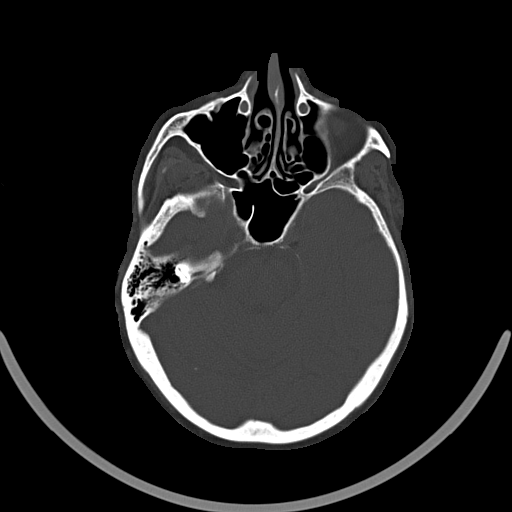
[im 30/71  bone]
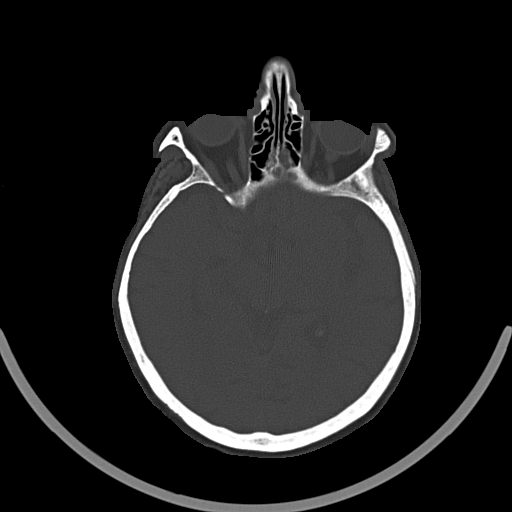
[im 41/71  bone]
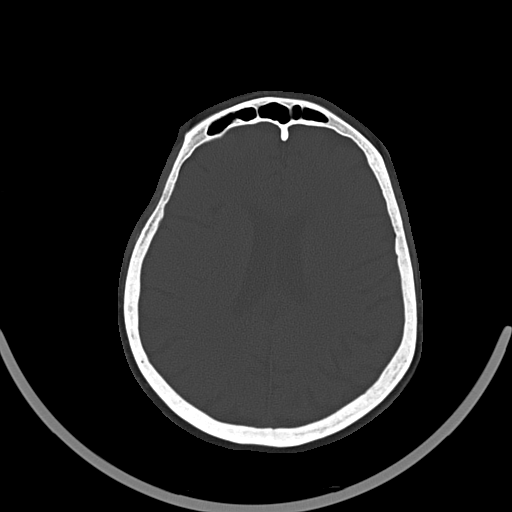
[im 48/71  bone]
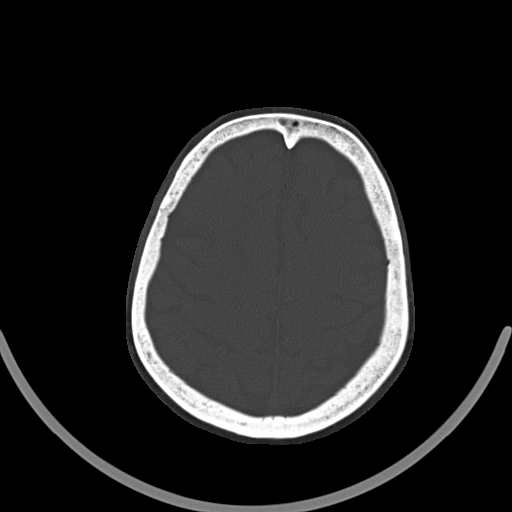
[im 56/71  bone]
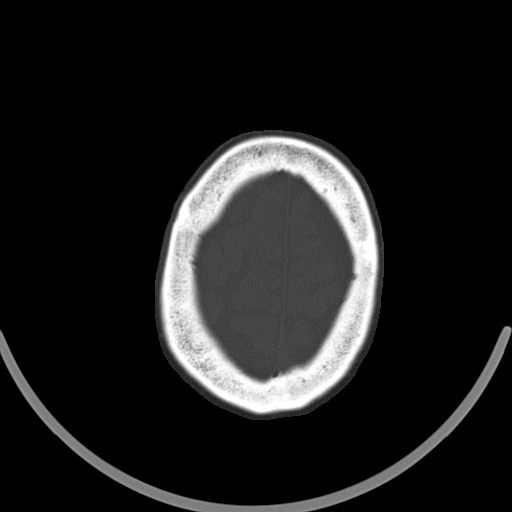
[im 63/71  bone]
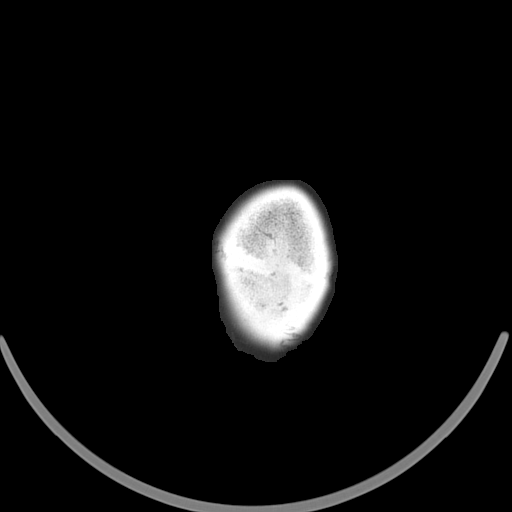

[16 of 30 positions shown; findings below may reference images not displayed]

FINDINGS: No skull fracture is noted.  Paranasal sinuses and
mastoid air cells are unremarkable.

No intracranial hemorrhage, mass effect or midline shift.  No
hydrocephalus.  The gray and white matter differentiation is
preserved.  No acute cortical infarction.  No mass lesion is noted
on this unenhanced scan.  No intra or extra-axial fluid collection.
IMPRESSION: No acute intracranial abnormality.
